# Patient Record
Sex: Female | Born: 1975
Health system: Southern US, Community
[De-identification: ages and names within clinical notes are randomized; demographics above are authoritative.]

## PROBLEM LIST (undated history)

## (undated) ENCOUNTER — Emergency Department (HOSPITAL_BASED_OUTPATIENT_CLINIC_OR_DEPARTMENT_OTHER): Admission: EM | Payer: Medicaid Other | Source: Home / Self Care

## (undated) DIAGNOSIS — B1920 Unspecified viral hepatitis C without hepatic coma: Secondary | ICD-10-CM

## (undated) DIAGNOSIS — A6000 Herpesviral infection of urogenital system, unspecified: Secondary | ICD-10-CM

## (undated) DIAGNOSIS — K219 Gastro-esophageal reflux disease without esophagitis: Secondary | ICD-10-CM

## (undated) DIAGNOSIS — I10 Essential (primary) hypertension: Secondary | ICD-10-CM

## (undated) DIAGNOSIS — E119 Type 2 diabetes mellitus without complications: Secondary | ICD-10-CM

## (undated) DIAGNOSIS — A549 Gonococcal infection, unspecified: Secondary | ICD-10-CM

## (undated) DIAGNOSIS — N83209 Unspecified ovarian cyst, unspecified side: Secondary | ICD-10-CM

## (undated) HISTORY — PX: CHOLECYSTECTOMY: SHX55

## (undated) HISTORY — DX: Gastro-esophageal reflux disease without esophagitis: K21.9

## (undated) HISTORY — PX: ABDOMINAL HYSTERECTOMY: SHX81

---

## 2010-05-29 ENCOUNTER — Ambulatory Visit: Payer: Self-pay | Admitting: Diagnostic Radiology

## 2010-05-29 ENCOUNTER — Emergency Department (HOSPITAL_BASED_OUTPATIENT_CLINIC_OR_DEPARTMENT_OTHER): Admission: EM | Admit: 2010-05-29 | Discharge: 2010-05-30 | Payer: Self-pay | Admitting: Emergency Medicine

## 2010-05-30 ENCOUNTER — Ambulatory Visit: Payer: Self-pay | Admitting: Diagnostic Radiology

## 2010-10-10 ENCOUNTER — Encounter (HOSPITAL_COMMUNITY)
Admission: RE | Admit: 2010-10-10 | Discharge: 2010-10-10 | Disposition: A | Discharge status: Home / Self Care | Payer: Medicaid Other | Source: Ambulatory Visit | Attending: Obstetrics and Gynecology | Admitting: Obstetrics and Gynecology

## 2010-10-10 LAB — CBC
HCT: 43.5 % (ref 36.0–46.0)
Hemoglobin: 14.2 g/dL (ref 12.0–15.0)
MCH: 29.1 pg (ref 26.0–34.0)
MCHC: 32.6 g/dL (ref 30.0–36.0)
MCV: 89.1 fL (ref 78.0–100.0)
RDW: 13.6 % (ref 11.5–15.5)

## 2010-10-10 LAB — COMPREHENSIVE METABOLIC PANEL
ALT: 29 U/L (ref 0–35)
BUN: 6 mg/dL (ref 6–23)
CO2: 26 mEq/L (ref 19–32)
Calcium: 9.2 mg/dL (ref 8.4–10.5)
Creatinine, Ser: 0.72 mg/dL (ref 0.4–1.2)
GFR calc non Af Amer: >60 mL/min (ref >60)
Glucose, Bld: 111 mg/dL — ABNORMAL HIGH (ref 70–99)
Sodium: 136 mEq/L (ref 135–145)
Total Protein: 7.9 g/dL (ref 6.0–8.3)

## 2010-10-10 LAB — SURGICAL PCR SCREEN
MRSA, PCR: NEGATIVE
Staphylococcus aureus: NEGATIVE

## 2010-10-17 ENCOUNTER — Other Ambulatory Visit: Payer: Self-pay | Admitting: Obstetrics and Gynecology

## 2010-10-17 ENCOUNTER — Inpatient Hospital Stay (HOSPITAL_COMMUNITY)
Admission: RE | Admit: 2010-10-17 | Discharge: 2010-10-20 | DRG: 743 | Disposition: A | Discharge status: Home / Self Care | Payer: Medicaid Other | Source: Ambulatory Visit | Attending: Obstetrics and Gynecology | Admitting: Obstetrics and Gynecology

## 2010-10-17 DIAGNOSIS — E785 Hyperlipidemia, unspecified: Secondary | ICD-10-CM | POA: Diagnosis present

## 2010-10-17 DIAGNOSIS — IMO0002 Reserved for concepts with insufficient information to code with codable children: Secondary | ICD-10-CM | POA: Diagnosis not present

## 2010-10-17 DIAGNOSIS — N920 Excessive and frequent menstruation with regular cycle: Principal | ICD-10-CM | POA: Diagnosis present

## 2010-10-17 DIAGNOSIS — N8000 Endometriosis of the uterus, unspecified: Secondary | ICD-10-CM | POA: Diagnosis present

## 2010-10-17 DIAGNOSIS — I10 Essential (primary) hypertension: Secondary | ICD-10-CM | POA: Diagnosis present

## 2010-10-17 LAB — HCG, SERUM, QUALITATIVE: Preg, Serum: NEGATIVE

## 2010-10-17 LAB — TYPE AND SCREEN
ABO/RH(D): B POS
Antibody Screen: NEGATIVE

## 2010-10-18 LAB — BASIC METABOLIC PANEL
BUN: 8 mg/dL (ref 6–23)
Chloride: 100 mEq/L (ref 96–112)
Creatinine, Ser: 0.86 mg/dL (ref 0.4–1.2)
GFR calc non Af Amer: >60 mL/min (ref >60)
Glucose, Bld: 129 mg/dL — ABNORMAL HIGH (ref 70–99)
Potassium: 4.3 mEq/L (ref 3.5–5.1)

## 2010-10-18 LAB — CBC
HCT: 42 % (ref 36.0–46.0)
MCH: 28.5 pg (ref 26.0–34.0)
MCV: 89.9 fL (ref 78.0–100.0)
Platelets: 166 10*3/uL (ref 150–400)
RDW: 13.8 % (ref 11.5–15.5)
WBC: 11.8 10*3/uL — ABNORMAL HIGH (ref 4.0–10.5)

## 2010-10-19 ENCOUNTER — Inpatient Hospital Stay (HOSPITAL_COMMUNITY): Payer: Medicaid Other

## 2010-10-27 NOTE — Op Note (Signed)
  Christine Callahan, Christine Callahan               ACCOUNT NO.:  [id number]  MEDICAL RECORD NO.:  [id number]           PATIENT TYPE:  I  LOCATION:  9318                          FACILITY:  [facility]  PHYSICIAN:  Excell Seltzer. Annabell Howells, M.D.    DATE OF BIRTH:  11/22/1975  DATE OF PROCEDURE:  10/17/2010 DATE OF DISCHARGE:                              OPERATIVE REPORT   The patient of Dr. Geryl Rankins  PROCEDURE:  Cystoscopy.  PREOPERATIVE DIAGNOSIS:  Rule out ureteral injury.  POSTOPERATIVE DIAGNOSIS:  No evidence of ureteral injury.  SURGEON:  Excell Seltzer. Annabell Howells, M.D.  ANESTHESIA:  General.  SPECIMEN:  None.  DRAIN:  None.  COMPLICATIONS:  None.  INDICATIONS:  Ms. [name] is a 35 year old African American female who underwent a difficult hysterectomy.  Postoperatively, she was noted to have no urine output.  She was given indigo carmine and then after 40 minutes, she still had no urine output.  There was concern for ureteral injury, so I was asked to see her.  In the interim, she had received additional hydration.  FINDINGS AND PROCEDURE:  She was in the lithotomy position.  Her perineum was reprepped and the drapes were adjusted.  A 21-French scope with a 30 degrees lens was inserted.  Inspection revealed a normal urethra.  The bladder wall was intact without injury.  The ureteral orifices were normal in the anatomic position and after a period of time, blue urine was noted to efflux from both ureteral orifices alleviating the need for retrograde pyelography. At this point, the cystoscope was removed.  A Foley catheter was reinserted.  She was taken down from lithotomy position.  Her anesthetic was reversed.  She was admitted to the recovery room in stable condition.  There were no complications.     Excell Seltzer. Annabell Howells, M.D.     JJW/MEDQ  D:  10/17/2010  T:  10/18/2010  Job:  [id number]  cc:   Pieter Partridge, MD Fax: [phone]  Electronically Signed by Bjorn Pippin M.D. on 10/27/2010 02:15:50  PM

## 2010-11-09 NOTE — Op Note (Signed)
Christine Callahan, Christine Callahan               ACCOUNT NO.:  [id number]  MEDICAL RECORD NO.:  [id number]           PATIENT TYPE:  I  LOCATION:  9318                          [facility]ACILITY:  [facility]  PHYSICIAN:  Pieter Partridge, MD   DATE OF BIRTH:  1976-03-21  DATE OF PROCEDURE:  10/17/2010 DATE OF DISCHARGE:                              OPERATIVE REPORT   PREOPERATIVE DIAGNOSIS:  Menorrhagia.  POSTOPERATIVE DIAGNOSIS:  Menorrhagia, omental adhesions.  PROCEDURE:  Laparoscopic-assisted vaginal hysterectomy, cystoscopy by urologist, Dr. Annabell Howells.  SURGEON:  Shela Nevin. Dion Body, MD  ASSISTANT:  Arlyce Harman, MD  INTRAOPERATIVE CONSULTATION:  Urologist, Dr. Annabell Howells.  ANESTHESIA:  General.  FINDINGS:  Uterus adhesed to anterior cul-de-sac, omental adhesions, and normal ovaries bilaterally.  SPECIMEN:  Uterus and cervix disposition to path.  ESTIMATED BLOOD LOSS:  150 mL.  URINE OUTPUT:  250 mL, blood tinged or bloody.  COMPLICATIONS:  None.  DISPOSITION:  To PACU in stable condition.  INDICATION:  Ms. Christine Callahan is a 35 year old gravida 3, para 2-0-01 with a history of menometrorrhagia for 2 years.  She has passed clots and flooding changing pad and tampons every hour.  She has even been to the emergency room at one point for excessive bleeding.  The patient desires definitive management.  She declined a endometrial ablation or medical therapy.  She does have chronic hypertension.  The patient was counseled on the risks, benefits, and alternatives.  Risks being injury to the bladder and ureters due to history of previous C-section and tubal.  The patient understood risk and accepted and desired to proceed with surgery.  PROCEDURE IN DETAIL:  Ms. Christine Callahan was identified in the holding area.  She was then taken to the operating room with IV fluids running.  She underwent general endotracheal anesthesia without complication.  She was placed in the dorsal lithotomy position with her left arm tucked at  her side and prepped and draped in a normal sterile fashion.  A Foley catheter was placed.  A Graves speculum was then placed in the vagina to identify the cervix.  The anterior lip of the cervix was grasped with the single-tooth tenaculum and the Hulka manipulator was advanced.  Single-tooth tenaculum was then removed.  Attention was then turned to the abdomen.  She had a previous laparoscopic incision below the umbilicus which had a keloid that was excised.  Then a 5-mm trocar was advanced through the abdomen while the patient was in Trendelenburg.  Intra-abdominal access was confirmed with the laparoscope.  CO2 gas was then used to insufflate the abdomen and the uterus was identified.  Another port on the left-hand side was placed, 5-mm trocar under direct visualization.  Of note, prior to inserting both of the ports 0.25% Marcaine was injected at the site.  There was an omental adhesion that was to the anterior abdominal wall which was blocking the view of the right adnexa.  I attempted to take that down with the gyrus and that was with some difficulty.  I then used the Endoshears to take down some of the omental adhesions, so that I could see the right-hand side.  I did not feel comfortable taking down all of the adhesions because I could not see around it.  We attempted to put another port which was in the mesh of the adnexa.  We were unable to put it in the right lower quadrant so it could be visualized.  It was able to displace some of the omentum out of the view of the field.  The round ligaments were grasped with the gyrus and cauterized and transected.  The right adnexa was then attempted to be transected. However, after the previous tubal, it was basically very free.  On the left-hand side, the round ligament was not very obvious and obliterated. However, the area was transected with the gyrus and then the adnexa was freed up as well.  I was unable to take it down because  of the adhesions of the uterus to the anterior wall, so then we went below.  A weighted speculum was then placed in the vagina which due to some redundant tissue and the length of the vagina that continued to bubble up in the away.  So, we used a long duckbill speculum prior to getting into the cul-de-sac.  The vasopressin was injected paracervically.  The cervix was then incised with the Bovie circumferentially.  The posterior cul-de-sac was then entered sharply with yellow fluid noted that was extended and the duckbill speculum was then placed.  I was unable to enter the anterior cul-de-sac, so with the curved Heaney clamps worked posteriorly transecting the uterosacrals and the uterine arteries and suture ligating those with 0-Vicryl.  I again attempted to get in anteriorly and I did never was able to get a plane on the anterior cul- de-sac.  I continued to work laterally with lateral wall retractors and at one point had the Breisky retractors but did grasping or skeletonizing the uterus with the curved Heaney clamps and cutting with the curved Mayo scissors and tying it down with 0-Vicryl.  At one point, I got to the cornu on each side and grasped the fundus of the uterus posteriorly, but it would not flip and it just would not give because of that dense adhesion on the front.  I was able to grasp the fundus and I had to reach from behind the fundus and placed my index finger and thumb on the adhesions.  I then placed a Heaney clamp blindly because I was unable to see it because it was so high.  I clamped very close to the uterus and then cut a very thick adhesion.  I did that several times and eventually the uterus came out.  Unfortunately because I was unable to see, I did not use a needle, I just used freehand ties twice to tie down the pedicle and flushing the first one but no bleeding was noted.  During the procedure, the ovary I believe what was the right ovary within the  field and that was displaced.  The bowel was also in the field and that was displaced with initially a laparotomy sponge and after that reamed with sponge on a stick at the uterus.  At one point, we actually transected the cervix to visualize to get up higher on the uterus.  The cuff was then closed with 0-Vicryl in a continuous running fashion. The angles were anchored with a 0-Vicryl.  Second layer of imbrication was used with 2-0 Vicryl.  There was no bleeding noted.  I called for indigo carmine to be injected while we were closing the  abdominal incisions and I closed the abdominal incisions with 4-0 Monocryl.  It was initially bleeding pretty briskly on the right lower trocar site but that stopped after it was reapproximated.  All incisions were closed with 4-0 Monocryl and Dermabond was applied.  At the completion of closing the incisions, there was no blue dye noted.  There was not even any urine in the Foley catheter.  We waited a little bit and the bolus was given and she did have some blood-tinged urine, but no blue dye was noted.  At that time, the urologist was called and consult was obtained.  The dye was given at 11:45 a.m. and he used was probably in the OR at about 12:45 and still no blue dye had appeared and there was minimal output.  The Foley catheter was checked to make sure it was not kinked which it was not.  He did the cystoscopy.  There were no bladder injuries noted on the left side.  There was blue dye jetting from the left ureter.  We waited on the right-hand side and there was some blue dye indigo from the right ureter.  The Foley catheter was then replaced.  The patient tolerated the procedure well.  No known complications from anesthesia.  When I looked back again intra-abdominally with the laparoscope after the procedure, definitely the peritoneum was adhesed and appeared normal postsurgical.  Bladder did not appear to be injured and there was  no active bleeding.  The patient tolerated the procedure well.  All instrument, sponge, and needle counts were correct x3.  She was then taken to the operating room in stable condition.     Pieter Partridge, MD     EBV/MEDQ  D:  10/17/2010  T:  10/18/2010  Job:  [id number]  Electronically Signed by Geryl Rankins MD on 11/09/2010 10:21:35 PM

## 2010-11-21 NOTE — Discharge Summary (Signed)
NAMEJOYANN, Christine Callahan               ACCOUNT NO.:  [id number]  MEDICAL RECORD NO.:  [id number]           PATIENT TYPE:  I  LOCATION:  9318                          FACILITY:  [facility]  PHYSICIAN:  Pieter Partridge, MD   DATE OF BIRTH:  [birth year]  DATE OF ADMISSION:  10/17/2010 DATE OF DISCHARGE:  10/20/2010                              DISCHARGE SUMMARY   ADMITTING DIAGNOSIS:  Menorrhagia.  DISCHARGE DIAGNOSES: 1. Menorrhagia. 2. Left acromioclavicular joint strain. 3. Omental adhesions.  PROCEDURES: 1. Laparoscopic-assisted vaginal hysterectomy. 2. Cystoscopy by urologist, Dr. Annabell Howells.  HISTORY OF PRESENT ILLNESS:  Please see dictated history and physical, but this is a 34-year gravida 3, para 2-8-1 with menometrorrhagia x2 years, positive clots and flooding, severe, cycles every month, desired definitive management, history of chronic hypertension which is managed by primary care doctor, and migraines.  The patient declined endometrial ablation and medications, desired definitive management.  On October 17, 2010, Christine Callahan underwent a laparoscopic-assisted vaginal hysterectomy which was essentially uncomplicated but the procedure was challenging due to adhesions of the uterus to the anterior cul-de-sac. The procedure was approximately 3-4 hours long.  EBL was 150.  Urine out was blood tinged to bloody of 250.  A cystoscopy was done at the end because indigo carmine was given and did not spill after 45 minutes. The urologist, upon arrival, did a cystoscopy and saw bilateral spillage of indigo carmine.  There are also no bladder injuries noted.  In the recovery room, the patient still, while partially sedated, was complaining of left shoulder pain on postoperative day #1, that was her major complaint that she was having significant discomfort in her left shoulder.  Her left arm was tucked for a laparoscopy during the surgery and no plates were put up against that arm.  Her movement  was limited to pain.  She was tolerating a regular diet and walking and no flatus yet as of postop day #1.  Her blood pressure was elevated overnight, but trended down to 145/72.  Her postop hemoglobin went from 14.2 to 13.3, white count was normal.  Blood pressure meds were continued.  Urine output was continued to be observed, it slowly cleared throughout her postoperative stay.  On postoperative day #2, the patient still was complaining of pain, unable to sleep.  An Orthopedic consult was then obtained and the orthopedists asked for some x-rays of the left shoulder to be taken.  He did see her late in the evening of postoperative day #2 and diagnosed her with an AC joint strain and prescribed Naprosyn 500 mg twice a day for 2 weeks.  The patient stayed an extra day just because it was so late to be discharged.  On the postoperative day #3, the patient said her shoulder was feeling a little bit better.  She did not have any bleeding or pain.  She had no abdominal pain, minimal spotting from the vagina, and was urinating without difficulty.  She was discharged home.  DISCHARGE INFORMATION:  Follow up in 2 weeks.  DISCHARGE MEDICATIONS:  Naprosyn 500 mg 1 tablet p.o. b.i.d. x4 weeks. She was to  continue her Prilosec, Wellbutrin, amlodipine.  She was also on losartan/HCTZ.  Percocet 5/325, #15 were dispensed.  ACTIVITY:  Pelvic rest, no heavy lifting.  DIET:  Low sodium.  Wound care was reviewed.     Pieter Partridge, MD     EBV/MEDQ  D:  11/15/2010  T:  11/16/2010  Job:  [id number]  Electronically Signed by Geryl Rankins MD on 11/21/2010 09:58:00 AM

## 2011-07-11 DIAGNOSIS — A6000 Herpesviral infection of urogenital system, unspecified: Secondary | ICD-10-CM

## 2011-07-11 HISTORY — DX: Herpesviral infection of urogenital system, unspecified: A60.00

## 2011-07-23 ENCOUNTER — Emergency Department (INDEPENDENT_AMBULATORY_CARE_PROVIDER_SITE_OTHER): Payer: Medicaid Other

## 2011-07-23 ENCOUNTER — Encounter (HOSPITAL_BASED_OUTPATIENT_CLINIC_OR_DEPARTMENT_OTHER): Payer: Self-pay | Admitting: *Deleted

## 2011-07-23 ENCOUNTER — Emergency Department (HOSPITAL_BASED_OUTPATIENT_CLINIC_OR_DEPARTMENT_OTHER)
Admission: EM | Admit: 2011-07-23 | Discharge: 2011-07-23 | Disposition: A | Discharge status: Home / Self Care | Payer: Medicaid Other | Attending: Emergency Medicine | Admitting: Emergency Medicine

## 2011-07-23 DIAGNOSIS — S6000XA Contusion of unspecified finger without damage to nail, initial encounter: Secondary | ICD-10-CM | POA: Insufficient documentation

## 2011-07-23 DIAGNOSIS — I880 Nonspecific mesenteric lymphadenitis: Secondary | ICD-10-CM | POA: Insufficient documentation

## 2011-07-23 DIAGNOSIS — I10 Essential (primary) hypertension: Secondary | ICD-10-CM | POA: Insufficient documentation

## 2011-07-23 DIAGNOSIS — IMO0002 Reserved for concepts with insufficient information to code with codable children: Secondary | ICD-10-CM | POA: Insufficient documentation

## 2011-07-23 DIAGNOSIS — S6990XA Unspecified injury of unspecified wrist, hand and finger(s), initial encounter: Secondary | ICD-10-CM

## 2011-07-23 DIAGNOSIS — N9489 Other specified conditions associated with female genital organs and menstrual cycle: Secondary | ICD-10-CM

## 2011-07-23 DIAGNOSIS — R109 Unspecified abdominal pain: Secondary | ICD-10-CM

## 2011-07-23 DIAGNOSIS — F17200 Nicotine dependence, unspecified, uncomplicated: Secondary | ICD-10-CM | POA: Insufficient documentation

## 2011-07-23 DIAGNOSIS — M79609 Pain in unspecified limb: Secondary | ICD-10-CM

## 2011-07-23 DIAGNOSIS — Z90710 Acquired absence of both cervix and uterus: Secondary | ICD-10-CM

## 2011-07-23 DIAGNOSIS — S6980XA Other specified injuries of unspecified wrist, hand and finger(s), initial encounter: Secondary | ICD-10-CM

## 2011-07-23 DIAGNOSIS — W230XXA Caught, crushed, jammed, or pinched between moving objects, initial encounter: Secondary | ICD-10-CM

## 2011-07-23 HISTORY — DX: Essential (primary) hypertension: I10

## 2011-07-23 LAB — URINALYSIS, ROUTINE W REFLEX MICROSCOPIC
Bilirubin Urine: NEGATIVE
Glucose, UA: NEGATIVE mg/dL
Ketones, ur: NEGATIVE mg/dL
Protein, ur: NEGATIVE mg/dL
pH: 6 (ref 5.0–8.0)

## 2011-07-23 MED ORDER — CIPROFLOXACIN HCL 500 MG PO TABS: 500.0000 mg | ORAL_TABLET | Freq: Two times a day (BID) | ORAL | Status: AC

## 2011-07-23 MED ORDER — HYDROCODONE-ACETAMINOPHEN 5-500 MG PO TABS: 1.0000 | ORAL_TABLET | Freq: Four times a day (QID) | ORAL | Status: AC | PRN

## 2011-07-23 MED ORDER — METRONIDAZOLE 500 MG PO TABS: 500.0000 mg | ORAL_TABLET | Freq: Two times a day (BID) | ORAL | Status: AC

## 2011-07-23 MED ORDER — KETOROLAC TROMETHAMINE 60 MG/2ML IM SOLN
60.0000 mg | Freq: Once | INTRAMUSCULAR | Status: AC
  Administered 2011-07-23: 60 mg via INTRAMUSCULAR
  Filled 2011-07-23: qty 2

## 2011-07-23 NOTE — ED Notes (Signed)
rx x 3 for cipro flagyl and hydrocodone given. Pt verbalizes understanding to f/u with PCP regarding CT results

## 2011-07-23 NOTE — ED Notes (Signed)
Patient transported to X-ray and returned 

## 2011-07-23 NOTE — ED Notes (Signed)
Pt is here for multiple complaints. She slammed her left ring finger in the door, left foot has been hurting for 2 weeks and she has right flank pain x 2 days. Dr. Is treating her for recurrent UTI's. "want me to see a specialist for possible stone"

## 2011-07-23 NOTE — ED Provider Notes (Signed)
History     CSN: [id number]  Arrival date & time 07/23/11  1724   First MD Initiated Contact with Patient 07/23/11 1751      Chief Complaint  Patient presents with  . Flank Pain    (Consider location/radiation/quality/duration/timing/severity/associated sxs/prior treatment) HPI Comments: Pt states that she has been having multiple uti and her doctor is concerned about a stone and when the pain worsened yesterday she decided that she needed to come in today:pt states that she slammed her left ring finger in the door and is now having pain:pt states that she is also have left foot pain which has been going on for 2 weeks:pt denies any injury  Patient is a 36 y.o. female presenting with flank pain. The history is provided by the patient. No language interpreter was used.  Flank Pain This is a new problem. The current episode started 1 to 4 weeks ago. The problem occurs constantly. The problem has been unchanged. Associated symptoms include vomiting. Pertinent negatives include no fever. The symptoms are aggravated by nothing. She has tried nothing for the symptoms.    Past Medical History  Diagnosis Date  . Hypertension     Past Surgical History  Procedure Date  . Abdominal hysterectomy   . Cholecystectomy     History reviewed. No pertinent family history.  History  Substance Use Topics  . Smoking status: Current Everyday Smoker  . Smokeless tobacco: Not on file  . Alcohol Use: Yes    OB History    Grav Para Term Preterm Abortions TAB SAB Ect Mult Living                  Review of Systems  Constitutional: Negative for fever.  Gastrointestinal: Positive for vomiting.  Genitourinary: Positive for flank pain.  All other systems reviewed and are negative.    Allergies  Review of patient's allergies indicates no known allergies.  Home Medications   Current Outpatient Rx  Name Route Sig Dispense Refill  . ADULT MULTIVITAMIN W/MINERALS CH Oral Take 1 tablet by  mouth daily.    Marland Kitchen PRILOSEC PO Oral Take 1 capsule by mouth daily.    Marland Kitchen PRESCRIPTION MEDICATION Oral Take 1 tablet by mouth daily. Blood pressure medication      BP 156/92  Pulse 96  Temp(Src) 98.8 F (37.1 C) (Oral)  Resp 18  Ht 5\' 9"  (1.753 m)  Wt 260 lb (117.935 kg)  BMI 38.40 kg/m2  SpO2 100%  Physical Exam  Nursing note and vitals reviewed. Constitutional: She is oriented to person, place, and time. She appears well-developed and well-nourished.  Eyes: Conjunctivae and EOM are normal.  Neck: Neck supple.  Cardiovascular: Normal rate and regular rhythm.   Pulmonary/Chest: Effort normal and breath sounds normal.  Abdominal: Soft. Bowel sounds are normal. There is no tenderness. There is CVA tenderness.  Musculoskeletal:       No swelling redness or deformity noted to the left foot:pt has mild swelling noted to the middle phalanx of the left ring finger  Neurological: She is oriented to person, place, and time.  Skin: Skin is warm and dry.  Psychiatric: She has a normal mood and affect.    ED Course  Procedures (including critical care time)   Labs Reviewed  URINALYSIS, ROUTINE W REFLEX MICROSCOPIC   Ct Abdomen Pelvis Wo Contrast  07/23/2011  *RADIOLOGY REPORT*  Clinical Data: Right flank pain for 2 months.  White count 11.8. History of hypertension, cholecystectomy, hysterectomy.  CT ABDOMEN  AND PELVIS WITHOUT CONTRAST  Technique:  Multidetector CT imaging of the abdomen and pelvis was performed following the standard protocol without intravenous contrast.  Comparison: None.  Findings: Lung bases are unremarkable.  No focal abnormality identified within the liver, spleen, pancreas, adrenal glands, or kidneys.  No intrarenal or ureteral calculi are identified.  The stomach, small bowel loops have a normal appearance. The appendix is well seen and has a normal appearance.  The colonic loops have a normal appearance.  The uterus is surgically absent.  Within the left adnexal  region, there is a rounded mass measuring 5.4 x 4.3 cm.  Although this is continuous with the sigmoid colon, adnexal process such as ovarian or fallopian tube processes favored.  The right adnexal region is unremarkable by CT.  There is no pelvic fluid or pelvic adenopathy.  Within the central mesentery, there is minimal strandy appearance. Small mesenteric lymph nodes are identified, measuring up to 1 cm in diameter.  IMPRESSION:  1.  No evidence for acute or renal stones. 2.  Mild stranding in the central mesentery, nonspecific in appearance.  Differential diagnosis includes mesenteric adenitis, gastroenteritis, or less likely, lymphoma. 3.  Left adnexal mass warrants further evaluation with non emergent follow-up pelvic ultrasound. 4.  Status post hysterectomy.  Original Report Authenticated By: Patterson Hammersmith, M.D.   Dg Finger Ring Left  07/23/2011  *RADIOLOGY REPORT*  Clinical Data: Injury, pain.  LEFT RING FINGER 2+V  Comparison: Plain films left hand 05/29/2010.  Findings: There is no acute bony or joint abnormality. Osteoarthritis of the DIP joint is noted.  IMPRESSION: No acute finding.  Original Report Authenticated By: Bernadene Bell. D'ALESSIO, M.D.     1. Mesenteric adenitis   2. Flank pain   3. Finger contusion   4. Adnexal mass       MDM  Discussed ct finding with ZO:XWRU treat for mesenteric adenitis and pt will have follow up with her pcp:no findings associated with the finger       Teressa Lower, NP 07/23/11 1927

## 2011-07-23 NOTE — ED Provider Notes (Signed)
Medical screening examination/treatment/procedure(s) were performed by non-physician practitioner and as supervising physician I was immediately available for consultation/collaboration.   Jakiah Bienaime A Laiza Veenstra, MD 07/23/11 2139 

## 2011-08-07 ENCOUNTER — Ambulatory Visit: Payer: Medicaid Other | Admitting: Physical Medicine & Rehabilitation

## 2011-08-07 ENCOUNTER — Encounter: Payer: Medicaid Other | Attending: Physical Medicine & Rehabilitation

## 2011-08-17 ENCOUNTER — Ambulatory Visit (HOSPITAL_BASED_OUTPATIENT_CLINIC_OR_DEPARTMENT_OTHER): Payer: Medicaid Other | Admitting: Physical Medicine & Rehabilitation

## 2011-08-17 ENCOUNTER — Encounter: Payer: Medicaid Other | Attending: Physical Medicine & Rehabilitation

## 2011-08-17 DIAGNOSIS — M545 Low back pain, unspecified: Secondary | ICD-10-CM

## 2011-08-17 DIAGNOSIS — I10 Essential (primary) hypertension: Secondary | ICD-10-CM | POA: Insufficient documentation

## 2011-08-17 DIAGNOSIS — F17200 Nicotine dependence, unspecified, uncomplicated: Secondary | ICD-10-CM | POA: Insufficient documentation

## 2011-08-17 DIAGNOSIS — M79609 Pain in unspecified limb: Secondary | ICD-10-CM | POA: Insufficient documentation

## 2011-08-17 DIAGNOSIS — IMO0002 Reserved for concepts with insufficient information to code with codable children: Secondary | ICD-10-CM

## 2011-08-17 NOTE — Progress Notes (Signed)
Christine Callahan is a 36 year old female, who gives about a 2-year history of low back pain.  She has had a ED visit in November, 2011 where she was lifting a TV and strained her back as well as her finger, however, she states that she had some pain before that time as well.  Within the last 6 months, she has had some left dorsal foot pain and sometimes some numbness and tingling in that foot as well.  Her average pain is 7/10. It gets up to 9 at sometimes, interferes with activity at a moderate level.  Her pain is described as sharp, burning, dull.  Pain is worse with walking, bending, standing; improves with either sitting in a supportive position or lying down.  Walking tolerance 5 minutes.  She can climb steps.  She can drive.  REVIEW OF SYSTEMS:  Trouble walking, tingling, urinary retention, painful urination, currently on Cipro, awaiting Urology appointment, weight gain.  PRIMARY CARE PHYSICIAN:  Jackie Plum, MD  PAST MEDICAL HISTORY:  Hypertension.  SOCIAL HISTORY:  Single, smokes half-pack per day.  Nondrinker.  No illegal drugs reported.  OBJECTIVE:  VITAL SIGNS:  Blood pressure 130/77, pulse 84, respirations regular, O2 sat 93% on room air, weight 272 pounds, height 5 feet 10 inches. GENERAL:  This is an obese female, in no acute distress.  Mood and affect are appropriate. MUSCULOSKELETAL:  Her neck has full range of motion.  Upper extremity strength is normal.  Upper extremity range of motion normal.  Lower extremity strength and range of motion are normal.  Deep tendon reflexes reduced in the right knee compared to left knee, but 1+ at bilateral ankles.  She has decreased sensation in the left L4 dermatomal distribution.  There is no evidence of atrophy, no skin discoloration. No lower extremity edema.  She has good pulses in the lower extremities. Her lumbar spine range of motion is 50% forward flexion and extension.  IMPRESSION: 1. Lumbar pain.  I reviewed x-rays from  May 30, 2011, really no     disk space narrowing.  She may have a radicular component, although     this is not clear-cut, other than L4 sensory loss.  We will trial     her on some gabapentin 100 mg t.i.d., as well as some Ultracet 1     p.o. t.i.d. 2. She has been through physical therapy once, but really did not     sound like they did any lumbar stabilization program.  I will write     specific instructions, send her back for 1 visit a week x4 weeks     and then reassess.  If she is not any better by that time, I will     see her again, I will order MRI.  We will consider lumbar     injections or other intervention depending on result. 3. Overall, I do not see a big role for narcotic analgesics in this     situation.  MEDICATIONS:  Losartan and hydrochlorothiazide 100/12.5 daily, nitrofurantoin 100 mg at bedtime, ciprofloxacin 500 mg b.i.d., omeprazole 20 mg a day, butalbital 1-2 q.4 hours p.r.n., amlodipine 10 mg daily, Pyridium 200 mg t.i.d., metronidazole 500 mg b.i.d., and phentermine 37.5 daily.  ALLERGIES:  None known.  PHARMACY:  [facility] in [facility].  Discussed with patient, agrees with plan.  Gave her some educational material on back basics.     Erick Colace, M.D. Electronically Signed    AEK/MedQ D:  08/17/2011 14:32:12  T:  08/17/2011 21:13:23  Job #:  [id number]  cc:   Jackie Plum, M.D. Fax: [phone]

## 2011-09-08 ENCOUNTER — Encounter: Payer: Medicaid Other | Attending: Physical Medicine & Rehabilitation

## 2011-09-08 ENCOUNTER — Encounter: Payer: Self-pay | Admitting: Physical Medicine & Rehabilitation

## 2011-09-08 ENCOUNTER — Ambulatory Visit (HOSPITAL_BASED_OUTPATIENT_CLINIC_OR_DEPARTMENT_OTHER): Payer: Medicaid Other | Admitting: Physical Medicine & Rehabilitation

## 2011-09-08 DIAGNOSIS — F17200 Nicotine dependence, unspecified, uncomplicated: Secondary | ICD-10-CM | POA: Insufficient documentation

## 2011-09-08 DIAGNOSIS — M25579 Pain in unspecified ankle and joints of unspecified foot: Secondary | ICD-10-CM

## 2011-09-08 DIAGNOSIS — M545 Low back pain, unspecified: Secondary | ICD-10-CM | POA: Insufficient documentation

## 2011-09-08 DIAGNOSIS — I10 Essential (primary) hypertension: Secondary | ICD-10-CM | POA: Insufficient documentation

## 2011-09-08 DIAGNOSIS — M5432 Sciatica, left side: Secondary | ICD-10-CM

## 2011-09-08 DIAGNOSIS — M5430 Sciatica, unspecified side: Secondary | ICD-10-CM

## 2011-09-08 DIAGNOSIS — M79609 Pain in unspecified limb: Secondary | ICD-10-CM | POA: Insufficient documentation

## 2011-09-08 MED ORDER — GABAPENTIN 300 MG PO CAPS: 300.0000 mg | ORAL_CAPSULE | Freq: Three times a day (TID) | ORAL | Status: DC

## 2011-09-08 MED ORDER — TRAMADOL-ACETAMINOPHEN 37.5-325 MG PO TABS: 1.0000 | ORAL_TABLET | ORAL | Status: DC | PRN

## 2011-09-08 NOTE — Progress Notes (Signed)
Subjective:    Patient ID: Christine Callahan, female    DOB: 1975-10-04, 36 y.o.   MRN: [id number]  HPI Christine Callahan is a 36 year old female, who gives about a 2-year history of low  back pain. She has had a ED visit in November, 2011 where she was  lifting a TV and strained her back as well as her finger, however, she  states that she had some pain before that time as well. Within the last  6 months, she has had some left dorsal foot pain and sometimes some  numbness and tingling in that foot as well. I ordered physical therapy last visit, we trialed her on some gabapentin and Ultracet. Reviewed x-ray. No significant disc space narrowing in 2011. She has not had any MRI of the lumbar area. She has seen a podiatrist who is planning to do some type of injections however she cannot tell me exactly what part will be injected. She plans to start her physical therapy next week Pain Inventory Average Pain 7 Pain Right Now 7 My pain is intermittent, sharp, dull and stabbing  In the last 24 hours, has pain interfered with the following? General activity 5 Relation with others 5 Enjoyment of life 4 What TIME of day is your pain at its worst? daytime and evening Sleep (in general) Poor  Pain is worse with: walking and bending Pain improves with: rest, heat/ice and lumbar support binder Relief from Meds: 6  Mobility walk without assistance how many minutes can you walk? 5 ability to climb steps?  yes do you drive?  yes Do you have any goals in this area?  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  no  Physicians involved in your care Any changes since last visit?  no Primary care Dr Cloyd Stagers Bonsu      Review of Systems  Constitutional: Positive for unexpected weight change.       Weight gain and high blood sugars  HENT: Negative.    Eyes: Negative.   Respiratory: Negative.   Cardiovascular: Negative.   Gastrointestinal: Negative.   Genitourinary: Negative.   Musculoskeletal: Positive for back pain.  Skin: Negative.   Neurological: Negative.   Hematological: Negative.   Psychiatric/Behavioral: Negative.        Objective:   Physical Exam  Constitutional: She is oriented to person, place, and time. She appears well-developed. No distress.       Obese  HENT:  Head: Normocephalic and atraumatic.  Eyes: Pupils are equal, round, and reactive to light.  Neck: Normal range of motion.  Musculoskeletal:       Right hip: Normal.       Left hip: She exhibits decreased range of motion.       Right knee: Normal.       Left knee: Normal.       Right ankle: Normal.       Left ankle: She exhibits decreased range of motion. tenderness. Lateral malleolus tenderness found.       Lumbar back: She exhibits decreased range of motion and pain. She exhibits no tenderness, no swelling and no spasm.  Neurological: She is alert and oriented to person, place, and time. She has normal reflexes. A sensory deficit is present.       Reduced sensation in the left L4, L5, and S1 dermatomal distribution  Skin: Skin is warm and dry.  Psychiatric: She has a normal mood and affect. Her behavior is normal. Judgment and thought content normal.          Assessment & Plan:  1. Lumbar pain and left  foot and ankle numbness and pain. It is difficult to say whether or not her left foot and ankle problems stem from her back or not. At this point she will followup with the podiatrist to see whether the injections around her foot are helpful. Will also initiate some physical therapy for her lumbar spine pain. We will trial her on increased dose of gabapentin as well as   Ultracet. I'll see her back in one month and if no better order MRI lumbar spine

## 2011-09-08 NOTE — Patient Instructions (Signed)
Sciatica with Rehab The sciatic nerve runs from the back down the leg and is responsible for sensation and control of the muscles in the back (posterior) side of the thigh, lower leg, and foot. Sciatica is a condition that is characterized by inflammation of this nerve.  SYMPTOMS   Signs of nerve damage, including numbness and/or weakness along the posterior side of the lower extremity.   Pain in the back of the thigh that may also travel down the leg.   Pain that worsens when sitting for long periods of time.   Occasionally, pain in the back or buttock.  CAUSES  Inflammation of the sciatic nerve is the cause of sciatica. The inflammation is due to something irritating the nerve. Common sources of irritation include:  Sitting for long periods of time.   Direct trauma to the nerve.   Arthritis of the spine.   Herniated or ruptured disk.   Slipping of the vertebrae (spondylolithesis)   Pressure from soft tissues, such as muscles or ligament-like tissue (fascia).  RISK INCREASES WITH:  Sports that place pressure or stress on the spine (football or weightlifting).   Poor strength and flexibility.   Failure to warm-up properly before activity.   Family history of low back pain or disk disorders.   Previous back injury or surgery.   Poor body mechanics, especially when lifting, or poor posture.  PREVENTION   Warm up and stretch properly before activity.   Maintain physical fitness:   Strength, flexibility, and endurance.   Cardiovascular fitness.   Learn and use proper technique, especially with posture and lifting. When possible, have coach correct improper technique.   Avoid activities that place stress on the spine.  PROGNOSIS If treated properly, then sciatica usually resolves within 6 weeks. However, occasionally surgery is necessary.  RELATED COMPLICATIONS   Permanent nerve damage, including pain, numbness, tingle, or weakness.   Chronic back pain.   Risks  of surgery: infection, bleeding, nerve damage, or damage to surrounding tissues.  TREATMENT Treatment initially involves resting from any activities that aggravate your symptoms. The use of ice and medication may help reduce pain and inflammation. The use of strengthening and stretching exercises may help reduce pain with activity. These exercises may be performed at home or with referral to a therapist. A therapist may recommend further treatments, such as transcutaneous electronic nerve stimulation (TENS) or ultrasound. Your caregiver may recommend corticosteroid injections to help reduce inflammation of the sciatic nerve. If symptoms persist despite non-surgical (conservative) treatment, then surgery may be recommended. MEDICATION  If pain medication is necessary, then nonsteroidal anti-inflammatory medications, such as aspirin and ibuprofen, or other minor pain relievers, such as acetaminophen, are often recommended.   Do not take pain medication for 7 days before surgery.   Prescription pain relievers may be given if deemed necessary by your caregiver. Use only as directed and only as much as you need.   Ointments applied to the skin may be helpful.   Corticosteroid injections may be given by your caregiver. These injections should be reserved for the most serious cases, because they may only be given a certain number of times.  HEAT AND COLD  Cold treatment (icing) relieves pain and reduces inflammation. Cold treatment should be applied for 10 to 15 minutes every 2 to 3 hours for inflammation and pain and immediately after any activity that aggravates your symptoms. Use ice packs or massage the area with a piece of ice (ice massage).   Heat treatment may   be used prior to performing the stretching and strengthening activities prescribed by your caregiver, physical therapist, or athletic trainer. Use a heat pack or soak the injury in warm water.  SEEK MEDICAL CARE IF:  Treatment seems to  offer no benefit, or the condition worsens.   Any medications produce adverse side effects.  EXERCISES  RANGE OF MOTION (ROM) AND STRETCHING EXERCISES - Sciatica Most people with sciatic will find that their symptoms worsen with either excessive bending forward (flexion) or arching at the low back (extension). The exercises which will help resolve your symptoms will focus on the opposite motion. Your physician, physical therapist or athletic trainer will help you determine which exercises will be most helpful to resolve your low back pain. Do not complete any exercises without first consulting with your clinician. Discontinue any exercises which worsen your symptoms until you speak to your clinician. If you have pain, numbness or tingling which travels down into your buttocks, leg or foot, the goal of the therapy is for these symptoms to move closer to your back and eventually resolve. Occasionally, these leg symptoms will get better, but your low back pain may worsen; this is typically an indication of progress in your rehabilitation. Be certain to be very alert to any changes in your symptoms and the activities in which you participated in the 24 hours prior to the change. Sharing this information with your clinician will allow him/her to most efficiently treat your condition. These exercises may help you when beginning to rehabilitate your injury. Your symptoms may resolve with or without further involvement from your physician, physical therapist or athletic trainer. While completing these exercises, remember:   Restoring tissue flexibility helps normal motion to return to the joints. This allows healthier, less painful movement and activity.   An effective stretch should be held for at least 30 seconds.   A stretch should never be painful. You should only feel a gentle lengthening or release in the stretched tissue.  FLEXION RANGE OF MOTION AND STRETCHING EXERCISES: STRETCH - Flexion, Single Knee  to Chest   Lie on a firm bed or floor with both legs extended in front of you.   Keeping one leg in contact with the floor, bring your opposite knee to your chest. Hold your leg in place by either grabbing behind your thigh or at your knee.   Pull until you feel a gentle stretch in your low back. Hold __________ seconds.   Slowly release your grasp and repeat the exercise with the opposite side.  Repeat __________ times. Complete this exercise __________ times per day.  STRETCH - Flexion, Double Knee to Chest  Lie on a firm bed or floor with both legs extended in front of you.   Keeping one leg in contact with the floor, bring your opposite knee to your chest.   Tense your stomach muscles to support your back and then lift your other knee to your chest. Hold your legs in place by either grabbing behind your thighs or at your knees.   Pull both knees toward your chest until you feel a gentle stretch in your low back. Hold __________ seconds.   Tense your stomach muscles and slowly return one leg at a time to the floor.  Repeat __________ times. Complete this exercise __________ times per day.  STRETCH - Low Trunk Rotation   Lie on a firm bed or floor. Keeping your legs in front of you, bend your knees so they are both pointed toward   the ceiling and your feet are flat on the floor.   Extend your arms out to the side. This will stabilize your upper body by keeping your shoulders in contact with the floor.   Gently and slowly drop both knees together to one side until you feel a gentle stretch in your low back. Hold for __________ seconds.   Tense your stomach muscles to support your low back as you bring your knees back to the starting position. Repeat the exercise to the other side.  Repeat __________ times. Complete this exercise __________ times per day  EXTENSION RANGE OF MOTION AND FLEXIBILITY EXERCISES: STRETCH - Extension, Prone on Elbows  Lie on your stomach on the floor, a  bed will be too soft. Place your palms about shoulder width apart and at the height of your head.   Place your elbows under your shoulders. If this is too painful, stack pillows under your chest.   Allow your body to relax so that your hips drop lower and make contact more completely with the floor.   Hold this position for __________ seconds.   Slowly return to lying flat on the floor.  Repeat __________ times. Complete this exercise __________ times per day.  RANGE OF MOTION - Extension, Prone Press Ups  Lie on your stomach on the floor, a bed will be too soft. Place your palms about shoulder width apart and at the height of your head.   Keeping your back as relaxed as possible, slowly straighten your elbows while keeping your hips on the floor. You may adjust the placement of your hands to maximize your comfort. As you gain motion, your hands will come more underneath your shoulders.   Hold this position __________ seconds.   Slowly return to lying flat on the floor.  Repeat __________ times. Complete this exercise __________ times per day.  STRENGTHENING EXERCISES - Sciatica  These exercises may help you when beginning to rehabilitate your injury. These exercises should be done near your "sweet spot." This is the neutral, low-back arch, somewhere between fully rounded and fully arched, that is your least painful position. When performed in this safe range of motion, these exercises can be used for people who have either a flexion or extension based injury. These exercises may resolve your symptoms with or without further involvement from your physician, physical therapist or athletic trainer. While completing these exercises, remember:   Muscles can gain both the endurance and the strength needed for everyday activities through controlled exercises.   Complete these exercises as instructed by your physician, physical therapist or athletic trainer. Progress with the resistance and  repetition exercises only as your caregiver advises.   You may experience muscle soreness or fatigue, but the pain or discomfort you are trying to eliminate should never worsen during these exercises. If this pain does worsen, stop and make certain you are following the directions exactly. If the pain is still present after adjustments, discontinue the exercise until you can discuss the trouble with your clinician.  STRENGTHENING - Deep Abdominals, Pelvic Tilt   Lie on a firm bed or floor. Keeping your legs in front of you, bend your knees so they are both pointed toward the ceiling and your feet are flat on the floor.   Tense your lower abdominal muscles to press your low back into the floor. This motion will rotate your pelvis so that your tail bone is scooping upwards rather than pointing at your feet or into the floor.     With a gentle tension and even breathing, hold this position for __________ seconds.  Repeat __________ times. Complete this exercise __________ times per day.  STRENGTHENING - Abdominals, Crunches   Lie on a firm bed or floor. Keeping your legs in front of you, bend your knees so they are both pointed toward the ceiling and your feet are flat on the floor. Cross your arms over your chest.   Slightly tip your chin down without bending your neck.   Tense your abdominals and slowly lift your trunk high enough to just clear your shoulder blades. Lifting higher can put excessive stress on the low back and does not further strengthen your abdominal muscles.   Control your return to the starting position.  Repeat __________ times. Complete this exercise __________ times per day.  STRENGTHENING - Quadruped, Opposite UE/LE Lift  Assume a hands and knees position on a firm surface. Keep your hands under your shoulders and your knees under your hips. You may place padding under your knees for comfort.   Find your neutral spine and gently tense your abdominal muscles so that you  can maintain this position. Your shoulders and hips should form a rectangle that is parallel with the floor and is not twisted.   Keeping your trunk steady, lift your right hand no higher than your shoulder and then your left leg no higher than your hip. Make sure you are not holding your breath. Hold this position __________ seconds.   Continuing to keep your abdominal muscles tense and your back steady, slowly return to your starting position. Repeat with the opposite arm and leg.  Repeat __________ times. Complete this exercise __________ times per day.  STRENGTHENING - Abdominals and Quadriceps, Straight Leg Raise   Lie on a firm bed or floor with both legs extended in front of you.   Keeping one leg in contact with the floor, bend the other knee so that your foot can rest flat on the floor.   Find your neutral spine, and tense your abdominal muscles to maintain your spinal position throughout the exercise.   Slowly lift your straight leg off the floor about 6 inches for a count of 15, making sure to not hold your breath.   Still keeping your neutral spine, slowly lower your leg all the way to the floor.  Repeat this exercise with each leg __________ times. Complete this exercise __________ times per day. POSTURE AND BODY MECHANICS CONSIDERATIONS - Sciatica Keeping correct posture when sitting, standing or completing your activities will reduce the stress put on different body tissues, allowing injured tissues a chance to heal and limiting painful experiences. The following are general guidelines for improved posture. Your physician or physical therapist will provide you with any instructions specific to your needs. While reading these guidelines, remember:  The exercises prescribed by your provider will help you have the flexibility and strength to maintain correct postures.   The correct posture provides the optimal environment for your joints to work. All of your joints have less wear  and tear when properly supported by a spine with good posture. This means you will experience a healthier, less painful body.   Correct posture must be practiced with all of your activities, especially prolonged sitting and standing. Correct posture is as important when doing repetitive low-stress activities (typing) as it is when doing a single heavy-load activity (lifting).  RESTING POSITIONS Consider which positions are most painful for you when choosing a resting position. If you have   pain with flexion-based activities (sitting, bending, stooping, squatting), choose a position that allows you to rest in a less flexed posture. You would want to avoid curling into a fetal position on your side. If your pain worsens with extension-based activities (prolonged standing, working overhead), avoid resting in an extended position such as sleeping on your stomach. Most people will find more comfort when they rest with their spine in a more neutral position, neither too rounded nor too arched. Lying on a non-sagging bed on your side with a pillow between your knees, or on your back with a pillow under your knees will often provide some relief. Keep in mind, being in any one position for a prolonged period of time, no matter how correct your posture, can still lead to stiffness. PROPER SITTING POSTURE In order to minimize stress and discomfort on your spine, you must sit with correct posture Sitting with good posture should be effortless for a healthy body. Returning to good posture is a gradual process. Many people can work toward this most comfortably by using various supports until they have the flexibility and strength to maintain this posture on their own. When sitting with proper posture, your ears will fall over your shoulders and your shoulders will fall over your hips. You should use the back of the chair to support your upper back. Your low back will be in a neutral position, just slightly arched. You may  place a small pillow or folded towel at the base of your low back for support.  When working at a desk, create an environment that supports good, upright posture. Without extra support, muscles fatigue and lead to excessive strain on joints and other tissues. Keep these recommendations in mind: CHAIR:   A chair should be able to slide under your desk when your back makes contact with the back of the chair. This allows you to work closely.   The chair's height should allow your eyes to be level with the upper part of your monitor and your hands to be slightly lower than your elbows.  BODY POSITION  Your feet should make contact with the floor. If this is not possible, use a foot rest.   Keep your ears over your shoulders. This will reduce stress on your neck and low back.  INCORRECT SITTING POSTURES   If you are feeling tired and unable to assume a healthy sitting posture, do not slouch or slump. This puts excessive strain on your back tissues, causing more damage and pain. Healthier options include:   Using more support, like a lumbar pillow.   Switching tasks to something that requires you to be upright or walking.   Talking a brief walk.   Lying down to rest in a neutral-spine position.  PROLONGED STANDING WHILE SLIGHTLY LEANING FORWARD  When completing a task that requires you to lean forward while standing in one place for a long time, place either foot up on a stationary 2-4 inch high object to help maintain the best posture. When both feet are on the ground, the low back tends to lose its slight inward curve. If this curve flattens (or becomes too large), then the back and your other joints will experience too much stress, fatigue more quickly and can cause pain.  CORRECT STANDING POSTURES Proper standing posture should be assumed with all daily activities, even if they only take a few moments, like when brushing your teeth. As in sitting, your ears should fall over your shoulders and    your shoulders should fall over your hips. You should keep a slight tension in your abdominal muscles to brace your spine. Your tailbone should point down to the ground, not behind your body, resulting in an over-extended swayback posture.  INCORRECT STANDING POSTURES  Common incorrect standing postures include a forward head, locked knees and/or an excessive swayback. WALKING Walk with an upright posture. Your ears, shoulders and hips should all line-up. PROLONGED ACTIVITY IN A FLEXED POSITION When completing a task that requires you to bend forward at your waist or lean over a low surface, try to find a way to stabilize 3 of 4 of your limbs. You can place a hand or elbow on your thigh or rest a knee on the surface you are reaching across. This will provide you more stability so that your muscles do not fatigue as quickly. By keeping your knees relaxed, or slightly bent, you will also reduce stress across your low back. CORRECT LIFTING TECHNIQUES DO :   Assume a wide stance. This will provide you more stability and the opportunity to get as close as possible to the object which you are lifting.   Tense your abdominals to brace your spine; then bend at the knees and hips. Keeping your back locked in a neutral-spine position, lift using your leg muscles. Lift with your legs, keeping your back straight.   Test the weight of unknown objects before attempting to lift them.   Try to keep your elbows locked down at your sides in order get the best strength from your shoulders when carrying an object.   Always ask for help when lifting heavy or awkward objects.  INCORRECT LIFTING TECHNIQUES DO NOT:   Lock your knees when lifting, even if it is a small object.   Bend and twist. Pivot at your feet or move your feet when needing to change directions.   Assume that you cannot safely pick up a paperclip without proper posture.  Document Released: 06/26/2005 Document Revised: 03/08/2011 Document  Reviewed: 10/08/2008 [employer] Patient Information 2012 [employer] 

## 2011-09-11 ENCOUNTER — Ambulatory Visit: Payer: Medicaid Other | Admitting: Physical Medicine & Rehabilitation

## 2011-10-16 ENCOUNTER — Ambulatory Visit (HOSPITAL_BASED_OUTPATIENT_CLINIC_OR_DEPARTMENT_OTHER): Payer: Medicaid Other | Admitting: Physical Medicine & Rehabilitation

## 2011-10-16 ENCOUNTER — Encounter: Payer: Self-pay | Admitting: Physical Medicine & Rehabilitation

## 2011-10-16 ENCOUNTER — Encounter: Payer: Medicaid Other | Attending: Physical Medicine & Rehabilitation

## 2011-10-16 VITALS — BP 169/110 | HR 63 | Resp 18 | Ht 70.0 in | Wt 268.4 lb

## 2011-10-16 DIAGNOSIS — M545 Low back pain, unspecified: Secondary | ICD-10-CM | POA: Insufficient documentation

## 2011-10-16 DIAGNOSIS — M79609 Pain in unspecified limb: Secondary | ICD-10-CM | POA: Insufficient documentation

## 2011-10-16 DIAGNOSIS — IMO0002 Reserved for concepts with insufficient information to code with codable children: Secondary | ICD-10-CM

## 2011-10-16 DIAGNOSIS — F17200 Nicotine dependence, unspecified, uncomplicated: Secondary | ICD-10-CM | POA: Insufficient documentation

## 2011-10-16 DIAGNOSIS — I10 Essential (primary) hypertension: Secondary | ICD-10-CM | POA: Insufficient documentation

## 2011-10-16 DIAGNOSIS — M5116 Intervertebral disc disorders with radiculopathy, lumbar region: Secondary | ICD-10-CM

## 2011-10-16 MED ORDER — GABAPENTIN 300 MG PO CAPS: 300.0000 mg | ORAL_CAPSULE | Freq: Four times a day (QID) | ORAL | Status: DC

## 2011-10-16 MED ORDER — TRAMADOL-ACETAMINOPHEN 37.5-325 MG PO TABS: 1.0000 | ORAL_TABLET | ORAL | Status: DC | PRN

## 2011-10-16 NOTE — Patient Instructions (Signed)
Sciatica  Sciatica is a name for lower back pain caused by pressure on the sciatic nerve. The back pain can spread to the buttocks and back of the leg.  HOME CARE    Rest as much as possible.   Only take medicine as told by your doctor.   Apply cold or heat to your back as told by your doctor.   Do not bend, lift, or sit for a long time until your pain is better.   Do not do anything that makes the condition worse.   Start normal activity when the pain is better.   Keep all follow-up visits.  GET HELP RIGHT AWAY IF:    There is more pain.   There is weakness or numbness in the legs.   You cannot control when you poop (bowel movement) or pee (urinate).  MAKE SURE YOU:    Understand these instructions.   Will watch this condition.   Will get help right away if you are not doing well or get worse.  Document Released: 04/04/2008 Document Revised: 06/15/2011 Document Reviewed: 04/04/2008  [employer] Patient Information 2012 [employer].

## 2011-10-16 NOTE — Progress Notes (Signed)
  Subjective:    Patient ID: Christine Callahan, female    DOB: Jan 10, 1976, 36 y.o.   MRN: [id number]  HPI Complains of back pain no significant radiating pain down the lower extremities. Has some dorsal foot pain. Has seen podiatry. The patient reports that podiatrist think it may be back related. Has gone through physical therapy. Continues with a home program. Has not worked since February due to pain. Pain Inventory Average Pain 8 Pain Right Now 8 My pain is constant, sharp and dull  In the last 24 hours, has pain interfered with the following? General activity 6 Relation with others 7 Enjoyment of life 2 What TIME of day is your pain at its worst? evening Sleep (in general) Fair  Pain is worse with: walking, standing and some activites Pain improves with: nothing  Relief from Meds: 0  Mobility walk without assistance how many minutes can you walk? 10 ability to climb steps?  yes do you drive?  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  no  Physicians involved in your care Any changes since last visit?  no Primary care Osei Bonsu      Review of Systems  Constitutional: Positive for diaphoresis and unexpected weight change.       Night sweats, weight gain  Genitourinary: Positive for urgency.  Musculoskeletal: Positive for back pain and gait problem.  All other systems reviewed and are negative.       Objective:   Physical Exam  Constitutional: She is oriented to person, place, and time. She appears well-developed.  HENT:  Head: Normocephalic and atraumatic.  Musculoskeletal:       Lumbar back: She exhibits decreased range of motion, tenderness and pain. She exhibits no bony tenderness, no swelling, no edema and no  deformity.       Lumbar paraspinal tenderness at L4-L5 level  Neurological: She is alert and oriented to person, place, and time. She has normal strength and normal reflexes. A sensory deficit is present.       Straight leg raising test is positive on the left side There is a sensory deficit left L3 L4-L5 and S1 dermatomal distribution  Psychiatric: She has a normal mood and affect. Her behavior is normal.          Assessment & Plan:  1. Low back pain with left lower extremity pain. She has some physical exam findings suggestive of lumbar radiculopathy on the left side. She has failed conservative care including physical therapy. We'll order MRI to further evaluate. Will increase her gabapentin. Will increase Ultracet to see the patient back in 2 weeks to review MRI

## 2011-10-17 ENCOUNTER — Encounter: Payer: Self-pay | Admitting: Physical Medicine & Rehabilitation

## 2011-10-24 ENCOUNTER — Ambulatory Visit (HOSPITAL_BASED_OUTPATIENT_CLINIC_OR_DEPARTMENT_OTHER)
Admission: RE | Admit: 2011-10-24 | Discharge: 2011-10-24 | Disposition: A | Discharge status: Home / Self Care | Payer: Medicaid Other | Source: Ambulatory Visit | Attending: Physical Medicine & Rehabilitation | Admitting: Physical Medicine & Rehabilitation

## 2011-10-24 DIAGNOSIS — M549 Dorsalgia, unspecified: Secondary | ICD-10-CM

## 2011-10-24 DIAGNOSIS — M5137 Other intervertebral disc degeneration, lumbosacral region: Secondary | ICD-10-CM

## 2011-10-24 DIAGNOSIS — M5116 Intervertebral disc disorders with radiculopathy, lumbar region: Secondary | ICD-10-CM

## 2011-10-24 DIAGNOSIS — M79609 Pain in unspecified limb: Secondary | ICD-10-CM

## 2011-10-24 DIAGNOSIS — M51379 Other intervertebral disc degeneration, lumbosacral region without mention of lumbar back pain or lower extremity pain: Secondary | ICD-10-CM | POA: Insufficient documentation

## 2011-10-24 DIAGNOSIS — M48061 Spinal stenosis, lumbar region without neurogenic claudication: Secondary | ICD-10-CM | POA: Insufficient documentation

## 2011-10-24 DIAGNOSIS — M5126 Other intervertebral disc displacement, lumbar region: Secondary | ICD-10-CM | POA: Insufficient documentation

## 2011-11-09 ENCOUNTER — Encounter: Payer: Medicaid Other | Attending: Physical Medicine & Rehabilitation

## 2011-11-09 ENCOUNTER — Encounter: Payer: Self-pay | Admitting: Physical Medicine & Rehabilitation

## 2011-11-09 ENCOUNTER — Ambulatory Visit (HOSPITAL_BASED_OUTPATIENT_CLINIC_OR_DEPARTMENT_OTHER): Payer: Medicaid Other | Admitting: Physical Medicine & Rehabilitation

## 2011-11-09 VITALS — BP 147/90 | HR 99 | Temp 99.4°F | Ht 70.0 in | Wt 269.0 lb

## 2011-11-09 DIAGNOSIS — M545 Low back pain, unspecified: Secondary | ICD-10-CM | POA: Insufficient documentation

## 2011-11-09 DIAGNOSIS — M79609 Pain in unspecified limb: Secondary | ICD-10-CM | POA: Insufficient documentation

## 2011-11-09 DIAGNOSIS — M5126 Other intervertebral disc displacement, lumbar region: Secondary | ICD-10-CM

## 2011-11-09 DIAGNOSIS — M5116 Intervertebral disc disorders with radiculopathy, lumbar region: Secondary | ICD-10-CM

## 2011-11-09 DIAGNOSIS — F17200 Nicotine dependence, unspecified, uncomplicated: Secondary | ICD-10-CM | POA: Insufficient documentation

## 2011-11-09 DIAGNOSIS — I10 Essential (primary) hypertension: Secondary | ICD-10-CM | POA: Insufficient documentation

## 2011-11-09 MED ORDER — ALPRAZOLAM 1 MG PO TABS: 1.0000 mg | ORAL_TABLET | Freq: Once | ORAL | Status: AC

## 2011-11-09 NOTE — Progress Notes (Signed)
Subjective:    Patient ID: Christine Callahan, female    DOB: [birth year], 36 y.o.   MRN: [id number]  HPI  Christine Callahan is a 36 year old female, who gives about a 2-year history of low  back pain. She has had a ED visit in November, 2011 where she was  lifting a TV and strained her back as well as her finger, however, she  states that she had some pain before that time as well. Within the last  6 months, she has had some left dorsal foot pain and sometimes some  numbness and tingling in that foot as well. I ordered physical therapy last visit, we trialed her on some gabapentin and Ultracet. Reviewed x-ray. No significant disc space narrowing in 2011. She has not had any MRI of the lumbar area Clinical Data: 36 year old female with back pain increasing and  radiating to the left foot.  MRI LUMBAR SPINE WITHOUT CONTRAST  Technique: Multiplanar and multiecho pulse sequences of the lumbar  spine were obtained without intravenous contrast.  Comparison: Lumbar radiographs 05/29/2010. CT abdomen and pelvis  07/23/2011.  Findings: Normal lumbar segmentation depicted on the comparison.  Visualized abdominal viscera and paraspinal soft tissues are within  normal limits. Visualized lower thoracic spinal cord is normal  with conus medularis at T12-L1. Normal vertebral height and  alignment. No marrow edema or evidence of acute osseous  abnormality.  T11-T12: Negative.  T12-L1: Negative.  L1-L2: Negative.  L2-L3: Mild facet hypertrophy. Negative disc.  L3-L4: Mild facet hypertrophy. Mild far lateral disc bulging.  Disc is inseparable from the exiting left L3 nerve (series 6 image  16). No spinal stenosis.  L4-L5: Mild facet hypertrophy. Mild disc desiccation and  circumferential disc bulge. Minimal to mild bilateral lateral  recess stenosis is slightly greater on the left. No spinal or  foraminal stenosis.  L5-S1: Disc desiccation. A small broad-based central disc  protrusion with annular tear (series 6  image 29). Mild facet  hypertrophy. Disc material in proximity of the bilateral  descending S1 nerve roots in the lateral recesses. No spinal  stenosis. Related to facet and endplate spurring, there is severe  right and mild to moderate left L5 foraminal stenosis.  IMPRESSION:  1. Mild L3-L4 disc degeneration partially affecting the left L3  foramen and might be a source of left L3 radiculitis.  2. L4-L5 disc degeneration and facet degeneration combining for  minimal to mild lateral recess stenosis which is slightly worse on  the left.  3. Disc and facet degeneration at L5-S1. The disc disease might  be a source for S1 radiculitis while there is multifactorial severe  right and mild-moderate left L5 foraminal stenosis.    Review of Systems  Musculoskeletal: Positive for back pain.  Neurological: Positive for numbness.       Objective:   Physical Exam  Constitutional: She is oriented to person, place, and time. She appears well-developed.  Musculoskeletal:       Lumbar back: She exhibits decreased range of motion and tenderness. She exhibits no deformity and no spasm.       Tenderness to palpation bilaterally at L5-S1 paraspinal muscles  Neurological: She is alert and oriented to person, place, and time. She has normal strength. She displays no atrophy. A sensory deficit is present. She exhibits normal muscle tone.  Reflex Scores:      Patellar reflexes are 1+ on the right side and 1+ on the left side.      Achilles reflexes are 1+ on  the right side and 1+ on the left side.      Decreased sensation in the left L4-L5-S1 dermatomal distribution          Assessment & Plan:  #1. Lumbar stenosis L S1 radiculopathy  Rec S1 transforaminal.  Failing this consider surgical referral Patient is apprehensive about getting an injection. Will give her Xanax 1 mg prior to injection. We discussed her MRI in great detail using a spine model as well as the actual films.

## 2011-11-09 NOTE — Patient Instructions (Addendum)
Need a driver next visit Take the xanax approximately one hour before your appointment

## 2011-11-09 NOTE — Progress Notes (Signed)
  Subjective:    Patient ID: Christine Callahan, female    DOB: [birth year], 36 y.o.   MRN: [id number]  HPI  Pain Inventory Average Pain 9 Pain Right Now 9 My pain is constant and sharp  In the last 24 hours, has pain interfered with the following? General activity 0 Relation with others 0 Enjoyment of life 0 What TIME of day is your pain at its worst? all the time Sleep (in general) Poor  Pain is worse with: walking, bending, standing and some activites Pain improves with: nothing Relief from Meds: 3  Mobility walk without assistance how many minutes can you walk? 5 min ability to climb steps?  yes do you drive?  no  Function not employed: date last employed   Neuro/Psych trouble walking  Prior Studies Any changes since last visit?  no  Physicians involved in your care Any changes since last visit?  no       Review of Systems  Constitutional: Negative.        Sleep apnea  HENT: Negative.   Eyes: Negative.   Respiratory: Negative.   Cardiovascular: Negative.   Gastrointestinal: Negative.   Genitourinary: Negative.   Musculoskeletal: Negative.   Skin: Negative.   Neurological: Negative.   Hematological: Negative.   Psychiatric/Behavioral: Negative.        Objective:   Physical Exam        Assessment & Plan:

## 2011-11-15 ENCOUNTER — Telehealth: Payer: Self-pay | Admitting: Physical Medicine & Rehabilitation

## 2011-11-15 NOTE — Telephone Encounter (Signed)
Please advise. Pt is currently taking Ultracet 1 Q 4hrs prn

## 2011-11-15 NOTE — Telephone Encounter (Signed)
Needs refill on Tramadol.  Would like to increase dose as it is helping some.  Please call.

## 2011-11-16 MED ORDER — TRAMADOL HCL 50 MG PO TABS: 50.0000 mg | ORAL_TABLET | Freq: Four times a day (QID) | ORAL | Status: AC | PRN

## 2011-11-16 NOTE — Telephone Encounter (Signed)
Rx for Tramadol #120

## 2011-11-16 NOTE — Telephone Encounter (Signed)
Pt aware rx has been sent in  

## 2011-11-21 ENCOUNTER — Ambulatory Visit: Payer: Medicaid Other | Admitting: Physical Medicine & Rehabilitation

## 2011-11-28 ENCOUNTER — Ambulatory Visit: Payer: Medicaid Other | Admitting: Physical Medicine & Rehabilitation

## 2011-12-21 ENCOUNTER — Other Ambulatory Visit: Payer: Self-pay

## 2011-12-21 MED ORDER — TRAMADOL HCL 50 MG PO TABS: 50.0000 mg | ORAL_TABLET | Freq: Four times a day (QID) | ORAL | Status: DC | PRN

## 2011-12-29 ENCOUNTER — Encounter: Payer: Medicaid Other | Attending: Physical Medicine & Rehabilitation

## 2011-12-29 ENCOUNTER — Encounter: Payer: Self-pay | Admitting: Physical Medicine & Rehabilitation

## 2011-12-29 ENCOUNTER — Ambulatory Visit (HOSPITAL_BASED_OUTPATIENT_CLINIC_OR_DEPARTMENT_OTHER): Payer: Medicaid Other | Admitting: Physical Medicine & Rehabilitation

## 2011-12-29 VITALS — BP 164/104 | HR 67 | Resp 14 | Ht 70.0 in | Wt 279.0 lb

## 2011-12-29 DIAGNOSIS — F17200 Nicotine dependence, unspecified, uncomplicated: Secondary | ICD-10-CM | POA: Insufficient documentation

## 2011-12-29 DIAGNOSIS — I10 Essential (primary) hypertension: Secondary | ICD-10-CM | POA: Insufficient documentation

## 2011-12-29 DIAGNOSIS — M545 Low back pain, unspecified: Secondary | ICD-10-CM | POA: Insufficient documentation

## 2011-12-29 DIAGNOSIS — M25579 Pain in unspecified ankle and joints of unspecified foot: Secondary | ICD-10-CM

## 2011-12-29 DIAGNOSIS — M79609 Pain in unspecified limb: Secondary | ICD-10-CM | POA: Insufficient documentation

## 2011-12-29 MED ORDER — TRAMADOL HCL 50 MG PO TABS: 100.0000 mg | ORAL_TABLET | Freq: Three times a day (TID) | ORAL | Status: AC | PRN

## 2011-12-29 MED ORDER — GABAPENTIN 300 MG PO CAPS: 300.0000 mg | ORAL_CAPSULE | Freq: Four times a day (QID) | ORAL | Status: DC

## 2011-12-29 NOTE — Progress Notes (Signed)
  Subjective:    Patient ID: Christine Callahan, female    DOB: 12-04-1975, 36 y.o.   MRN: [id number]  HPI Has left foot pain. Sees a podiatrist. Reviewed MRI. Has significant foraminal stenosis at L5-S1 on the right side and only mild to moderate on the left side. Has not had an EMG study.Pain Inventory Average Pain 7 Pain Right Now 7 My pain is sharp, burning and dull  In the last 24 hours, has pain interfered with the following? General activity 3 Relation with others 2 Enjoyment of life 0 What TIME of day is your pain at its worst? daytime Sleep (in general) Poor  Pain is worse with: walking, bending, sitting and standing Pain improves with: rest Relief from Meds: 3  Mobility walk without assistance how many minutes can you walk? 5 ability to climb steps?  yes do you drive?  no  Function not employed: date last employed  I need assistance with the following:  household duties  Neuro/Psych weakness trouble walking  Prior Studies Any changes since last visit?  no  Physicians involved in your care Any changes since last visit?  no   Family History  Problem Relation Age of Onset  . Lupus Mother   . Seizures Father    History   Social History  . Marital Status: Divorced    Spouse Name: N/A    Number of Children: N/A  . Years of Education: N/A   Social History Main Topics  . Smoking status: Current Everyday Smoker -- 0.5 packs/day for 20 years    Types: Cigarettes  . Smokeless tobacco: Never Used  . Alcohol Use: Yes  . Drug Use: No  . Sexually Active: None   Other Topics Concern  . None   Social History Narrative  . None   Past Surgical History  Procedure Date  . Abdominal hysterectomy   . Cholecystectomy    Past Medical History  Diagnosis Date  . Hypertension   . GERD (gastroesophageal reflux disease)    BP 164/104  Pulse 67  Resp 14  Ht 5\' 10"  (1.778 m)  Wt 279 lb (126.554 kg)  BMI 40.03 kg/m2  SpO2 99%     Review of Systems    Musculoskeletal: Positive for back pain.  Neurological: Positive for weakness.       Objective:   Physical Exam  Morbidly obese Left foot without tenderness to palpation in the toes as well as the foot and ankle area. Full range of motion without pain. Sensory exam is normal. Deep tendon reflexes are normal in both lower extremity strength is normal in both lower extremities Mood and affect are appropriate although she expresses frustration about her left leg      Assessment & Plan:  1. Back and left lower extremity pain not clearly radicular. Will check EMG NCV. I do not think narcotic analgesics are indicated in this situation. We'll continue tramadol but increase to 2 tablets 3 times per day.

## 2011-12-29 NOTE — Patient Instructions (Signed)
Increase tramadol to 2 tablets 3 times per day We will do I nerve study next visit in approximately one month

## 2012-10-14 ENCOUNTER — Ambulatory Visit: Payer: Self-pay | Admitting: Podiatry

## 2012-10-21 ENCOUNTER — Ambulatory Visit: Payer: Self-pay | Admitting: Podiatry

## 2012-10-22 ENCOUNTER — Ambulatory Visit: Payer: Self-pay | Admitting: Podiatry

## 2012-12-31 ENCOUNTER — Telehealth: Payer: Self-pay

## 2012-12-31 NOTE — Telephone Encounter (Signed)
Has not been seen for year please schedule with Christine Callahan for recheck

## 2012-12-31 NOTE — Telephone Encounter (Signed)
Patient called requesting tramadol refill to [facility].  Please advise.

## 2013-01-01 NOTE — Telephone Encounter (Signed)
Tried to contact patient.  First call was dropped and second call she was not at the location but they would tell her to call.

## 2013-01-21 ENCOUNTER — Other Ambulatory Visit: Payer: Self-pay

## 2013-01-21 MED ORDER — TRAMADOL HCL 50 MG PO TABS: 100.0000 mg | ORAL_TABLET | Freq: Three times a day (TID) | ORAL | Status: DC

## 2013-04-02 ENCOUNTER — Telehealth: Payer: Self-pay

## 2013-04-02 NOTE — Telephone Encounter (Signed)
Patient has not been seen in over a year in the clinic. contacted patient to let her know she would need to make an appt. Patient did not seem to want to make a appt. just wanted a refill for the tramadol.

## 2013-04-02 NOTE — Telephone Encounter (Signed)
Patient is requesting a refill on Tramadol

## 2013-07-10 DIAGNOSIS — A549 Gonococcal infection, unspecified: Secondary | ICD-10-CM

## 2013-07-10 HISTORY — DX: Gonococcal infection, unspecified: A54.9

## 2013-07-11 ENCOUNTER — Emergency Department (HOSPITAL_BASED_OUTPATIENT_CLINIC_OR_DEPARTMENT_OTHER)
Admission: EM | Admit: 2013-07-11 | Discharge: 2013-07-11 | Disposition: A | Discharge status: Home / Self Care | Payer: Medicaid Other | Attending: Emergency Medicine | Admitting: Emergency Medicine

## 2013-07-11 ENCOUNTER — Encounter (HOSPITAL_BASED_OUTPATIENT_CLINIC_OR_DEPARTMENT_OTHER): Payer: Self-pay | Admitting: Emergency Medicine

## 2013-07-11 DIAGNOSIS — Z792 Long term (current) use of antibiotics: Secondary | ICD-10-CM | POA: Insufficient documentation

## 2013-07-11 DIAGNOSIS — N760 Acute vaginitis: Secondary | ICD-10-CM | POA: Insufficient documentation

## 2013-07-11 DIAGNOSIS — Z79899 Other long term (current) drug therapy: Secondary | ICD-10-CM | POA: Insufficient documentation

## 2013-07-11 DIAGNOSIS — R350 Frequency of micturition: Secondary | ICD-10-CM | POA: Insufficient documentation

## 2013-07-11 DIAGNOSIS — F17200 Nicotine dependence, unspecified, uncomplicated: Secondary | ICD-10-CM | POA: Insufficient documentation

## 2013-07-11 DIAGNOSIS — E119 Type 2 diabetes mellitus without complications: Secondary | ICD-10-CM | POA: Insufficient documentation

## 2013-07-11 DIAGNOSIS — B9689 Other specified bacterial agents as the cause of diseases classified elsewhere: Secondary | ICD-10-CM | POA: Insufficient documentation

## 2013-07-11 DIAGNOSIS — R300 Dysuria: Secondary | ICD-10-CM | POA: Insufficient documentation

## 2013-07-11 DIAGNOSIS — M545 Low back pain, unspecified: Secondary | ICD-10-CM | POA: Insufficient documentation

## 2013-07-11 DIAGNOSIS — A499 Bacterial infection, unspecified: Secondary | ICD-10-CM | POA: Insufficient documentation

## 2013-07-11 DIAGNOSIS — K219 Gastro-esophageal reflux disease without esophagitis: Secondary | ICD-10-CM | POA: Insufficient documentation

## 2013-07-11 DIAGNOSIS — I10 Essential (primary) hypertension: Secondary | ICD-10-CM | POA: Insufficient documentation

## 2013-07-11 HISTORY — DX: Type 2 diabetes mellitus without complications: E11.9

## 2013-07-11 LAB — WET PREP, GENITAL
Trich, Wet Prep: NONE SEEN
YEAST WET PREP: NONE SEEN

## 2013-07-11 LAB — URINALYSIS, ROUTINE W REFLEX MICROSCOPIC
BILIRUBIN URINE: NEGATIVE
Glucose, UA: NEGATIVE mg/dL
Hgb urine dipstick: NEGATIVE
Ketones, ur: NEGATIVE mg/dL
Leukocytes, UA: NEGATIVE
Nitrite: NEGATIVE
Protein, ur: NEGATIVE mg/dL
SPECIFIC GRAVITY, URINE: 1.023 (ref 1.005–1.030)
UROBILINOGEN UA: 1 mg/dL (ref 0.0–1.0)
pH: 5.5 (ref 5.0–8.0)

## 2013-07-11 MED ORDER — HYDROCODONE-ACETAMINOPHEN 5-325 MG PO TABS
2.0000 | ORAL_TABLET | Freq: Once | ORAL | Status: AC
  Administered 2013-07-11: 2 via ORAL
  Filled 2013-07-11: qty 2

## 2013-07-11 MED ORDER — HYDROCODONE-ACETAMINOPHEN 5-325 MG PO TABS: 1.0000 | ORAL_TABLET | ORAL | Status: DC | PRN

## 2013-07-11 MED ORDER — METRONIDAZOLE 500 MG PO TABS: 500.0000 mg | ORAL_TABLET | Freq: Two times a day (BID) | ORAL | Status: DC

## 2013-07-11 MED ORDER — PHENAZOPYRIDINE HCL 95 MG PO TABS: 95.0000 mg | ORAL_TABLET | Freq: Three times a day (TID) | ORAL | Status: DC | PRN

## 2013-07-11 NOTE — Discharge Instructions (Signed)
Bacterial Vaginosis Bacterial vaginosis (BV) is a vaginal infection where the normal balance of bacteria in the vagina is disrupted. The normal balance is then replaced by an overgrowth of certain bacteria. There are several different kinds of bacteria that can cause BV. BV is the most common vaginal infection in women of childbearing age. CAUSES   The cause of BV is not fully understood. BV develops when there is an increase or imbalance of harmful bacteria.  Some activities or behaviors can upset the normal balance of bacteria in the vagina and put women at increased risk including:  Having a new sex partner or multiple sex partners.  Douching.  Using an intrauterine device (IUD) for contraception.  It is not clear what role sexual activity plays in the development of BV. However, women that have never had sexual intercourse are rarely infected with BV. Women do not get BV from toilet seats, bedding, swimming pools or from touching objects around them.  SYMPTOMS   Grey vaginal discharge.  A fish-like odor with discharge, especially after sexual intercourse.  Itching or burning of the vagina and vulva.  Burning or pain with urination.  Some women have no signs or symptoms at all. DIAGNOSIS  Your caregiver must examine the vagina for signs of BV. Your caregiver will perform lab tests and look at the sample of vaginal fluid through a microscope. They will look for bacteria and abnormal cells (clue cells), a pH test higher than 4.5, and a positive amine test all associated with BV.  RISKS AND COMPLICATIONS   Pelvic inflammatory disease (PID).  Infections following gynecology surgery.  Developing HIV.  Developing herpes virus. TREATMENT  Sometimes BV will clear up without treatment. However, all women with symptoms of BV should be treated to avoid complications, especially if gynecology surgery is planned. Female partners generally do not need to be treated. However, BV may spread  between female sex partners so treatment is helpful in preventing a recurrence of BV.   BV may be treated with antibiotics. The antibiotics come in either pill or vaginal cream forms. Either can be used with nonpregnant or pregnant women, but the recommended dosages differ. These antibiotics are not harmful to the baby.  BV can recur after treatment. If this happens, a second round of antibiotics will often be prescribed.  Treatment is important for pregnant women. If not treated, BV can cause a premature delivery, especially for a pregnant woman who had a premature birth in the past. All pregnant women who have symptoms of BV should be checked and treated.  For chronic reoccurrence of BV, treatment with a type of prescribed gel vaginally twice a week is helpful. HOME CARE INSTRUCTIONS   Finish all medication as directed by your caregiver.  Do not have sex until treatment is completed.  Tell your sexual partner that you have a vaginal infection. They should see their caregiver and be treated if they have problems, such as a mild rash or itching.  Practice safe sex. Use condoms. Only have 1 sex partner. PREVENTION  Basic prevention steps can help reduce the risk of upsetting the natural balance of bacteria in the vagina and developing BV:  Do not have sexual intercourse (be abstinent).  Do not douche.  Use all of the medicine prescribed for treatment of BV, even if the signs and symptoms go away.  Tell your sex partner if you have BV. That way, they can be treated, if needed, to prevent reoccurrence. SEEK MEDICAL CARE IF:     Your symptoms are not improving after 3 days of treatment.  You have increased discharge, pain, or fever. MAKE SURE YOU:   Understand these instructions.  Will watch your condition.  Will get help right away if you are not doing well or get worse. FOR MORE INFORMATION  Division of STD Prevention (DSTDP), Centers for Disease Control and Prevention:  www.cdc.gov/std American [employer] Association (ASHA): www.ashastd.org  Document Released: 06/26/2005 Document Revised: 09/18/2011 Document Reviewed: 02/05/2013 [employer] Patient Information 2014 [employer].  

## 2013-07-11 NOTE — ED Notes (Signed)
Pt. Reports she has a ride home before RN gave meds ordered by EDP

## 2013-07-11 NOTE — ED Provider Notes (Signed)
TIME SEEN: 7:00 PM  CHIEF COMPLAINT: Abdominal Pain  HPI:  Ky Moskowitz [name] is a 38 y.o. female with a h/o HTN, GERD, and DM who presents to the Emergency Department complaining of suprapubic abdominal pain, lower back pain, urinary frequency, and dysuria onset one month ago. She was seen by her PCP one month ago for the same, was told her UA was negative. She has a h/o partial hysterectomy 3 years ago and reports dysuria intermittently since although she keeps testing negative for UTI. She has been going to a urologist. She has an appointment with her OB GYN, Dr. Neva Seat on Jan 12th.  She denies fevers, chills, nausea, vomiting, diarrhea, blood in her urine, vaginal discharge or bleeding. She has a h/o chlamydia years ago. She had a normal cystoscopy in 2013.   PCP - Osei A Bonsu, DO  ROS: See HPI Constitutional: no fever  Eyes: no drainage  ENT: no runny nose   Cardiovascular:  no chest pain  Resp: no SOB  GI: no vomiting GU: dysuria, frequency Integumentary: no rash  Allergy: no hives  Musculoskeletal: no leg swelling  Neurological: no slurred speech ROS otherwise negative  PAST MEDICAL HISTORY/PAST SURGICAL HISTORY:  Past Medical History  Diagnosis Date  . Hypertension   . GERD (gastroesophageal reflux disease)   . Diabetes mellitus without complication     MEDICATIONS:  Prior to Admission medications   Medication Sig Start Date End Date Taking? Authorizing Provider  ATENOLOL PO Take by mouth.   Yes Historical Provider, MD  LISINOPRIL PO Take by mouth.   Yes Historical Provider, MD  amLODipine (NORVASC) 10 MG tablet Take 10 mg by mouth daily.    Historical Provider, MD  buPROPion (WELLBUTRIN XL) 300 MG 24 hr tablet Take 300 mg by mouth daily.    Historical Provider, MD  carisoprodol (SOMA) 350 MG tablet Take 350 mg by mouth 4 (four) times daily as needed.    Historical Provider, MD  ciprofloxacin (CIPRO) 500 MG tablet Take 500 mg by mouth 2 (two) times daily.    Historical  Provider, MD  gabapentin (NEURONTIN) 300 MG capsule Take 1 capsule (300 mg total) by mouth 4 (four) times daily. 12/29/11 12/28/12  Erick Colace, MD  losartan-hydrochlorothiazide (HYZAAR) 100-12.5 MG per tablet Take 1 tablet by mouth daily.    Historical Provider, MD  metoprolol succinate (TOPROL-XL) 25 MG 24 hr tablet Take 25 mg by mouth daily.    Historical Provider, MD  metroNIDAZOLE (FLAGYL) 500 MG tablet Take 500 mg by mouth 2 (two) times daily.    Historical Provider, MD  Multiple Vitamin (MULITIVITAMIN WITH MINERALS) TABS Take 1 tablet by mouth daily.    Historical Provider, MD  Omeprazole (PRILOSEC PO) Take 1 capsule by mouth daily.    Historical Provider, MD  omeprazole (PRILOSEC) 20 MG capsule Take 20 mg by mouth daily.    Historical Provider, MD  phentermine 37.5 MG capsule Take 37.5 mg by mouth every morning.    Historical Provider, MD  pravastatin (PRAVACHOL) 20 MG tablet Take 20 mg by mouth daily.    Historical Provider, MD  PRESCRIPTION MEDICATION Take 1 tablet by mouth daily. Blood pressure medication    Historical Provider, MD  traMADol (ULTRAM) 50 MG tablet Take 2 tablets (100 mg total) by mouth 3 (three) times daily. 01/21/13   Erick Colace, MD    ALLERGIES:  No Known Allergies  SOCIAL HISTORY:  History  Substance Use Topics  . Smoking status: Current Every  Day Smoker -- 0.50 packs/day for 20 years    Types: Cigarettes  . Smokeless tobacco: Never Used  . Alcohol Use: Yes    FAMILY HISTORY: Family History  Problem Relation Age of Onset  . Lupus Mother   . Seizures Father     EXAM: BP 111/64  Pulse 82  Temp(Src) 98.3 F (36.8 C) (Oral)  Resp 20  Ht 5\' 10"  (1.778 m)  Wt 265 lb (120.203 kg)  BMI 38.02 kg/m2  SpO2 95% CONSTITUTIONAL: Alert and oriented and responds appropriately to questions. Well-appearing; well-nourished HEAD: Normocephalic EYES: Conjunctivae clear, PERRL ENT: normal nose; no rhinorrhea; moist mucous membranes; pharynx without  lesions noted NECK: Supple, no meningismus, no LAD  CARD: RRR; S1 and S2 appreciated; no murmurs, no clicks, no rubs, no gallops RESP: Normal chest excursion without splinting or tachypnea; breath sounds clear and equal bilaterally; no wheezes, no rhonchi, no rales,  ABD/GI: Normal bowel sounds; non-distended; soft, non-tender, no rebound, no guarding GU: Normal external genitalia, no vaginal bleeding, normal-appearing cervix, no adnexal tenderness or fullness, no cervical motion tenderness, thick, white vaginal discharge BACK:  The back appears normal and is non-tender to palpation, there is no CVA tenderness EXT: Normal ROM in all joints; non-tender to palpation; no edema; normal capillary refill; no cyanosis    SKIN: Normal color for age and race; warm NEURO: Moves all extremities equally PSYCH: The patient's mood and manner are appropriate. Grooming and personal hygiene are appropriate.  MEDICAL DECISION MAKING: Patient with dysuria, urinary frequency and suprapubic pain for several weeks has been intermittent for 3 years. She is hemodynamically stable. Her abdominal exam is benign. Urine shows no sign of infection. We'll perform pelvic exam with cultures.  ED PROGRESS: Patient with benign pelvic exam. Wet prep and gonorrhea and Chlamydia cultures pending. No concern for PID, TOA, torsion, ovarian cysts.   9:09 PM  Pt's wet prep shows moderate clue cells but no Trichomonas or yeast. Will discharge home with Flagyl and pain medication. She has OB/GYN followup. Given return precautions. Patient verbalizes understanding and is comfortable with plan.    Layla MawKristen N Aiken Withem, DO 07/11/13 2110

## 2013-07-11 NOTE — ED Notes (Signed)
Abdominal pain. Dysuria. She was seen by her MD a month ago for same and is no better.

## 2013-07-13 LAB — GC/CHLAMYDIA PROBE AMP
CT PROBE, AMP APTIMA: NEGATIVE
GC PROBE AMP APTIMA: NEGATIVE

## 2013-08-11 ENCOUNTER — Ambulatory Visit: Payer: Self-pay | Admitting: Podiatry

## 2013-09-17 ENCOUNTER — Ambulatory Visit: Payer: Self-pay | Admitting: Podiatry

## 2013-09-18 ENCOUNTER — Emergency Department (HOSPITAL_BASED_OUTPATIENT_CLINIC_OR_DEPARTMENT_OTHER)
Admission: EM | Admit: 2013-09-18 | Discharge: 2013-09-18 | Disposition: A | Discharge status: Home / Self Care | Payer: Medicaid Other | Attending: Emergency Medicine | Admitting: Emergency Medicine

## 2013-09-18 ENCOUNTER — Encounter (HOSPITAL_BASED_OUTPATIENT_CLINIC_OR_DEPARTMENT_OTHER): Payer: Self-pay | Admitting: Emergency Medicine

## 2013-09-18 DIAGNOSIS — R10A1 Flank pain, right side: Secondary | ICD-10-CM

## 2013-09-18 DIAGNOSIS — K219 Gastro-esophageal reflux disease without esophagitis: Secondary | ICD-10-CM | POA: Insufficient documentation

## 2013-09-18 DIAGNOSIS — N760 Acute vaginitis: Secondary | ICD-10-CM | POA: Insufficient documentation

## 2013-09-18 DIAGNOSIS — Z792 Long term (current) use of antibiotics: Secondary | ICD-10-CM | POA: Insufficient documentation

## 2013-09-18 DIAGNOSIS — R109 Unspecified abdominal pain: Secondary | ICD-10-CM

## 2013-09-18 DIAGNOSIS — I10 Essential (primary) hypertension: Secondary | ICD-10-CM | POA: Insufficient documentation

## 2013-09-18 DIAGNOSIS — E119 Type 2 diabetes mellitus without complications: Secondary | ICD-10-CM | POA: Insufficient documentation

## 2013-09-18 DIAGNOSIS — A499 Bacterial infection, unspecified: Secondary | ICD-10-CM | POA: Insufficient documentation

## 2013-09-18 DIAGNOSIS — B9689 Other specified bacterial agents as the cause of diseases classified elsewhere: Secondary | ICD-10-CM

## 2013-09-18 DIAGNOSIS — F17200 Nicotine dependence, unspecified, uncomplicated: Secondary | ICD-10-CM | POA: Insufficient documentation

## 2013-09-18 DIAGNOSIS — Z87440 Personal history of urinary (tract) infections: Secondary | ICD-10-CM | POA: Insufficient documentation

## 2013-09-18 DIAGNOSIS — Z79899 Other long term (current) drug therapy: Secondary | ICD-10-CM | POA: Insufficient documentation

## 2013-09-18 LAB — URINE MICROSCOPIC-ADD ON

## 2013-09-18 LAB — URINALYSIS, ROUTINE W REFLEX MICROSCOPIC
Bilirubin Urine: NEGATIVE
Glucose, UA: >1000 mg/dL — AB
Hgb urine dipstick: NEGATIVE
Ketones, ur: NEGATIVE mg/dL
Leukocytes, UA: NEGATIVE
NITRITE: NEGATIVE
PH: 6 (ref 5.0–8.0)
Protein, ur: 100 mg/dL — AB
SPECIFIC GRAVITY, URINE: 1.02 (ref 1.005–1.030)
UROBILINOGEN UA: 0.2 mg/dL (ref 0.0–1.0)

## 2013-09-18 LAB — WET PREP, GENITAL
Trich, Wet Prep: NONE SEEN
Yeast Wet Prep HPF POC: NONE SEEN

## 2013-09-18 MED ORDER — METRONIDAZOLE 500 MG PO TABS
500.0000 mg | ORAL_TABLET | Freq: Once | ORAL | Status: AC
  Administered 2013-09-18: 500 mg via ORAL
  Filled 2013-09-18: qty 1

## 2013-09-18 MED ORDER — HYDROCODONE-ACETAMINOPHEN 5-325 MG PO TABS
1.0000 | ORAL_TABLET | Freq: Once | ORAL | Status: AC
  Administered 2013-09-18: 1 via ORAL
  Filled 2013-09-18: qty 1

## 2013-09-18 MED ORDER — METRONIDAZOLE 500 MG PO TABS: 500.0000 mg | ORAL_TABLET | Freq: Two times a day (BID) | ORAL | Status: DC

## 2013-09-18 MED ORDER — HYDROCODONE-ACETAMINOPHEN 5-325 MG PO TABS: 1.0000 | ORAL_TABLET | Freq: Four times a day (QID) | ORAL | Status: DC | PRN

## 2013-09-18 MED ORDER — FLUCONAZOLE 100 MG PO TABS
ORAL_TABLET | ORAL | Status: AC
  Filled 2013-09-18: qty 1

## 2013-09-18 MED ORDER — FLUCONAZOLE 50 MG PO TABS
150.0000 mg | ORAL_TABLET | Freq: Once | ORAL | Status: AC
  Administered 2013-09-18: 150 mg via ORAL
  Filled 2013-09-18 (×2): qty 1

## 2013-09-18 NOTE — ED Notes (Signed)
Lab called to state they have not received the GC/Ch probe.  Order found to discontinue.

## 2013-09-18 NOTE — ED Notes (Signed)
MD at bedside. 

## 2013-09-18 NOTE — ED Notes (Signed)
C/o dysuria, low back pain, and urinary frequency

## 2013-09-18 NOTE — ED Provider Notes (Signed)
CSN: [id number]     Arrival date & time 09/18/13  0251 History   First MD Initiated Contact with Patient 09/18/13 0301     Chief Complaint  Patient presents with  . Urinary Frequency     (Consider location/radiation/quality/duration/timing/severity/associated sxs/prior Treatment) HPI This is a 38 year old female with a one-week history of urinary frequency and burning with urination "real bad". Symptoms acutely worsened yesterday. She denies associated vaginal discharge, vaginal bleeding, genital rash or lesions or abnormal odor. The symptoms are similar to previous urinary tract infections although worse. She has had chills but is unaware of having a fever. She denies nausea or vomiting. She is having left suprapubic pain as well as right flank pain. She has not taken any medications for this.  Past Medical History  Diagnosis Date  . Hypertension   . GERD (gastroesophageal reflux disease)   . Diabetes mellitus without complication    Past Surgical History  Procedure Laterality Date  . Abdominal hysterectomy    . Cholecystectomy     Family History  Problem Relation Age of Onset  . Lupus Mother   . Seizures Father    History  Substance Use Topics  . Smoking status: Current Every Day Smoker -- 0.50 packs/day for 20 years    Types: Cigarettes  . Smokeless tobacco: Never Used  . Alcohol Use: No   OB History   Grav Para Term Preterm Abortions TAB SAB Ect Mult Living                 Review of Systems  All other systems reviewed and are negative.    Allergies  Review of patient's allergies indicates no known allergies.  Home Medications   Current Outpatient Rx  Name  Route  Sig  Dispense  Refill  . amLODipine (NORVASC) 10 MG tablet   Oral   Take 10 mg by mouth daily.         . ATENOLOL PO   Oral   Take by mouth.         . gabapentin (NEURONTIN) 300 MG capsule   Oral   Take 1 capsule (300 mg total) by mouth 4 (four) times daily.   120 capsule   1   .  LISINOPRIL PO   Oral   Take by mouth.         . metFORMIN (GLUCOPHAGE) 500 MG tablet   Oral   Take by mouth daily with breakfast.         . Omeprazole (PRILOSEC PO)   Oral   Take 1 capsule by mouth daily.         Marland Kitchen. buPROPion (WELLBUTRIN XL) 300 MG 24 hr tablet   Oral   Take 300 mg by mouth daily.         . carisoprodol (SOMA) 350 MG tablet   Oral   Take 350 mg by mouth 4 (four) times daily as needed.         . ciprofloxacin (CIPRO) 500 MG tablet   Oral   Take 500 mg by mouth 2 (two) times daily.         Marland Kitchen. HYDROcodone-acetaminophen (NORCO/VICODIN) 5-325 MG per tablet   Oral   Take 1 tablet by mouth every 4 (four) hours as needed.   15 tablet   0   . losartan-hydrochlorothiazide (HYZAAR) 100-12.5 MG per tablet   Oral   Take 1 tablet by mouth daily.         . metoprolol succinate (TOPROL-XL) 25  MG 24 hr tablet   Oral   Take 25 mg by mouth daily.         . metroNIDAZOLE (FLAGYL) 500 MG tablet   Oral   Take 500 mg by mouth 2 (two) times daily.         . metroNIDAZOLE (FLAGYL) 500 MG tablet   Oral   Take 1 tablet (500 mg total) by mouth 2 (two) times daily. Do not drink alcohol with this medication.   14 tablet   0   . Multiple Vitamin (MULITIVITAMIN WITH MINERALS) TABS   Oral   Take 1 tablet by mouth daily.         Marland Kitchen omeprazole (PRILOSEC) 20 MG capsule   Oral   Take 20 mg by mouth daily.         . phenazopyridine (PYRIDIUM) 95 MG tablet   Oral   Take 1 tablet (95 mg total) by mouth 3 (three) times daily as needed for pain.   10 tablet   0   . phentermine 37.5 MG capsule   Oral   Take 37.5 mg by mouth every morning.         . pravastatin (PRAVACHOL) 20 MG tablet   Oral   Take 20 mg by mouth daily.         Marland Kitchen PRESCRIPTION MEDICATION   Oral   Take 1 tablet by mouth daily. Blood pressure medication         . traMADol (ULTRAM) 50 MG tablet   Oral   Take 2 tablets (100 mg total) by mouth 3 (three) times daily.   30 tablet    1    BP 118/73  Pulse 78  Temp(Src) 98.6 F (37 C) (Oral)  Resp 20  Ht 5' 9.5" (1.765 m)  Wt 260 lb (117.935 kg)  BMI 37.86 kg/m2  SpO2 100%  Physical Exam General: Well-developed, obese female in no acute distress; appearance consistent with age of record HENT: normocephalic; atraumatic Eyes: pupils equal, round and reactive to light; extraocular muscles intact Neck: supple Heart: regular rate and rhythm Lungs: clear to auscultation bilaterally Abdomen: soft; nondistended; mild left suprapubic tenderness; no masses or hepatosplenomegaly; bowel sounds present GU: Right CVA tenderness; whitish vulva discharge Extremities: No deformity; full range of motion; pulses normal Neurologic: Awake, alert and oriented; motor function intact in all extremities and symmetric; no facial droop Skin: Warm and dry Psychiatric: Flat affect    ED Course  Procedures (including critical care time)    MDM   Nursing notes and vitals signs, including pulse oximetry, reviewed.  Summary of this visit's results, reviewed by myself:  Labs:  Results for orders placed during the hospital encounter of 09/18/13 (from the past 24 hour(s))  URINALYSIS, ROUTINE W REFLEX MICROSCOPIC     Status: Abnormal   Collection Time    09/18/13  2:55 AM      Result Value Ref Range   Color, Urine YELLOW  YELLOW   APPearance CLOUDY (*) CLEAR   Specific Gravity, Urine 1.020  1.005 - 1.030   pH 6.0  5.0 - 8.0   Glucose, UA >1000 (*) NEGATIVE mg/dL   Hgb urine dipstick NEGATIVE  NEGATIVE   Bilirubin Urine NEGATIVE  NEGATIVE   Ketones, ur NEGATIVE  NEGATIVE mg/dL   Protein, ur 409 (*) NEGATIVE mg/dL   Urobilinogen, UA 0.2  0.0 - 1.0 mg/dL   Nitrite NEGATIVE  NEGATIVE   Leukocytes, UA NEGATIVE  NEGATIVE  URINE MICROSCOPIC-ADD ON  Status: Abnormal   Collection Time    09/18/13  2:55 AM      Result Value Ref Range   Squamous Epithelial / LPF MANY (*) RARE   WBC, UA 0-2  <3 WBC/hpf   RBC / HPF 0-2  <3  RBC/hpf   Bacteria, UA FEW (*) RARE   Urine-Other MUCOUS PRESENT    WET PREP, GENITAL     Status: Abnormal   Collection Time    09/18/13  3:20 AM      Result Value Ref Range   Yeast Wet Prep HPF POC NONE SEEN  NONE SEEN   Trich, Wet Prep NONE SEEN  NONE SEEN   Clue Cells Wet Prep HPF POC MODERATE (*) NONE SEEN   WBC, Wet Prep HPF POC MODERATE (*) NONE SEEN   3:38 AM Urine sent for culture as patient states she has had negative urinalyses that later showed UTI on culture. We'll treat for bacterial vaginosis. The cause of her back pain is not clearly related to her dysuria and may represent exacerbation of her previous episodic back pain.       Hanley Seamen, MD 09/18/13 [phone]

## 2013-09-19 ENCOUNTER — Ambulatory Visit (INDEPENDENT_AMBULATORY_CARE_PROVIDER_SITE_OTHER): Payer: Medicaid Other | Admitting: Podiatry

## 2013-09-19 ENCOUNTER — Encounter: Payer: Self-pay | Admitting: Podiatry

## 2013-09-19 VITALS — BP 112/70 | HR 70 | Ht 69.5 in | Wt 275.0 lb

## 2013-09-19 DIAGNOSIS — E1149 Type 2 diabetes mellitus with other diabetic neurological complication: Secondary | ICD-10-CM

## 2013-09-19 DIAGNOSIS — E1140 Type 2 diabetes mellitus with diabetic neuropathy, unspecified: Secondary | ICD-10-CM | POA: Insufficient documentation

## 2013-09-19 DIAGNOSIS — M25579 Pain in unspecified ankle and joints of unspecified foot: Secondary | ICD-10-CM

## 2013-09-19 DIAGNOSIS — M216X9 Other acquired deformities of unspecified foot: Secondary | ICD-10-CM

## 2013-09-19 DIAGNOSIS — M79606 Pain in leg, unspecified: Secondary | ICD-10-CM

## 2013-09-19 DIAGNOSIS — E1142 Type 2 diabetes mellitus with diabetic polyneuropathy: Secondary | ICD-10-CM

## 2013-09-19 DIAGNOSIS — L600 Ingrowing nail: Secondary | ICD-10-CM | POA: Insufficient documentation

## 2013-09-19 DIAGNOSIS — L851 Acquired keratosis [keratoderma] palmaris et plantaris: Secondary | ICD-10-CM

## 2013-09-19 DIAGNOSIS — B351 Tinea unguium: Secondary | ICD-10-CM

## 2013-09-19 DIAGNOSIS — M79609 Pain in unspecified limb: Secondary | ICD-10-CM

## 2013-09-19 LAB — URINE CULTURE
COLONY COUNT: NO GROWTH
CULTURE: NO GROWTH

## 2013-09-19 NOTE — Patient Instructions (Signed)
Seen for painful feet.  Noted for having Diabetic Neuropathy, ingrown nail and tight Achilles tendon.  Today all nails and calluses debrided. Reviewed Stretch exercise for tight Achilles tendon. May benefit from ingrown nail surgery, Stretch exercise, Neuremedy Nerve vitamin, and Custom Orthotics. May return for ingrown nail surgery or for routine diabetic care.

## 2013-09-19 NOTE — Progress Notes (Signed)
Subjective: 38 year old female presents complaining of big toe on right foot hurts and calluses on bottom of both feet hurt.  She is on her feet about 9 hours a day.  Stated that sh has been diagnosed for diabetes since last year and is under control.  Review of Systems - General ROS: negative for - chills, fatigue, fever, night sweats, sleep disturbance or weight loss Ophthalmic ROS: Appointment with eye doctor for having head aches. ENT ROS: negative Endocrine ROS: negative Breast ROS: negative for breast lumps Respiratory ROS: no cough, shortness of breath, or wheezing Cardiovascular ROS: no chest pain or dyspnea on exertion Gastrointestinal ROS: no abdominal pain, change in bowel habits, or black or bloody stools Genito-Urinary ROS: no dysuria, trouble voiding, or hematuria Musculoskeletal ROS: negative Neurological ROS: no TIA or stroke symptoms Dermatological ROS: Severe callus problem on both feet that are painful.   Objective: Dermatologic: Ingrown thick nail right great toe medial border. Hypertrophic nails x 10.  Thick broad callus under lateral column about at the base of the 5th Metatarsal R>L. This is painful. No open skin lesions or acute changes noted.  Vascular: All pedal pulses are palpable. No edema or erythema noted.  Orthopedic:  Tight Achilles tendon R>L. Rearfoot varus. Forefoot varus severe. Neurologic:  Subjective tingling and pricking sensations both feet L>R.  Fail to response to Monofilament sensory testing.   Assessment: Diabetic neuropathy. Plantar keratosis bilateral, symptomatic. Mycotic nails x 10. Symptomatic ingrown nail right hallux medial border without infection. Ankle equinus bilateral. Forefoot varus bilateral.   Plan: Debrided all calluses, nails. Instructed to stretch Achilles tendon.  Continue to Neurontin and explained the availability of NeuRemedy.  Custom orthotics explained.  May benefit from ingrown nail surgery on right  great toe medial border.

## 2013-10-03 ENCOUNTER — Ambulatory Visit: Payer: Medicaid Other | Admitting: Podiatry

## 2013-10-31 ENCOUNTER — Emergency Department (HOSPITAL_BASED_OUTPATIENT_CLINIC_OR_DEPARTMENT_OTHER)
Admission: EM | Admit: 2013-10-31 | Discharge: 2013-10-31 | Disposition: A | Discharge status: Home / Self Care | Payer: Medicaid Other | Attending: Emergency Medicine | Admitting: Emergency Medicine

## 2013-10-31 ENCOUNTER — Encounter (HOSPITAL_BASED_OUTPATIENT_CLINIC_OR_DEPARTMENT_OTHER): Payer: Self-pay | Admitting: Emergency Medicine

## 2013-10-31 DIAGNOSIS — S335XXA Sprain of ligaments of lumbar spine, initial encounter: Secondary | ICD-10-CM | POA: Insufficient documentation

## 2013-10-31 DIAGNOSIS — I10 Essential (primary) hypertension: Secondary | ICD-10-CM | POA: Insufficient documentation

## 2013-10-31 DIAGNOSIS — Y92410 Unspecified street and highway as the place of occurrence of the external cause: Secondary | ICD-10-CM | POA: Insufficient documentation

## 2013-10-31 DIAGNOSIS — S4980XA Other specified injuries of shoulder and upper arm, unspecified arm, initial encounter: Secondary | ICD-10-CM | POA: Insufficient documentation

## 2013-10-31 DIAGNOSIS — Y9389 Activity, other specified: Secondary | ICD-10-CM | POA: Insufficient documentation

## 2013-10-31 DIAGNOSIS — F17200 Nicotine dependence, unspecified, uncomplicated: Secondary | ICD-10-CM | POA: Insufficient documentation

## 2013-10-31 DIAGNOSIS — S39012A Strain of muscle, fascia and tendon of lower back, initial encounter: Secondary | ICD-10-CM

## 2013-10-31 DIAGNOSIS — Z792 Long term (current) use of antibiotics: Secondary | ICD-10-CM | POA: Insufficient documentation

## 2013-10-31 DIAGNOSIS — Z79899 Other long term (current) drug therapy: Secondary | ICD-10-CM | POA: Insufficient documentation

## 2013-10-31 DIAGNOSIS — E119 Type 2 diabetes mellitus without complications: Secondary | ICD-10-CM | POA: Insufficient documentation

## 2013-10-31 DIAGNOSIS — S46909A Unspecified injury of unspecified muscle, fascia and tendon at shoulder and upper arm level, unspecified arm, initial encounter: Secondary | ICD-10-CM | POA: Insufficient documentation

## 2013-10-31 DIAGNOSIS — K219 Gastro-esophageal reflux disease without esophagitis: Secondary | ICD-10-CM | POA: Insufficient documentation

## 2013-10-31 MED ORDER — HYDROCODONE-ACETAMINOPHEN 5-325 MG PO TABS: ORAL_TABLET | ORAL | Status: DC

## 2013-10-31 MED ORDER — CYCLOBENZAPRINE HCL 10 MG PO TABS: 10.0000 mg | ORAL_TABLET | Freq: Two times a day (BID) | ORAL | Status: DC | PRN

## 2013-10-31 NOTE — ED Notes (Signed)
Pt. Reports she was on her way to work and now cant go to work.

## 2013-10-31 NOTE — Discharge Instructions (Signed)
Lumbosacral Strain Lumbosacral strain is a strain of any of the parts that make up your lumbosacral vertebrae. Your lumbosacral vertebrae are the bones that make up the lower third of your backbone. Your lumbosacral vertebrae are held together by muscles and tough, fibrous tissue (ligaments).  CAUSES  A sudden blow to your back can cause lumbosacral strain. Also, anything that causes an excessive stretch of the muscles in the low back can cause this strain. This is typically seen when people exert themselves strenuously, fall, lift heavy objects, bend, or crouch repeatedly. RISK FACTORS  Physically demanding work.  Participation in pushing or pulling sports or sports that require sudden twist of the back (tennis, golf, baseball).  Weight lifting.  Excessive lower back curvature.  Forward-tilted pelvis.  Weak back or abdominal muscles or both.  Tight hamstrings. SIGNS AND SYMPTOMS  Lumbosacral strain may cause pain in the area of your injury or pain that moves (radiates) down your leg.  DIAGNOSIS Your health care provider can often diagnose lumbosacral strain through a physical exam. In some cases, you may need tests such as X-ray exams.  TREATMENT  Treatment for your lower back injury depends on many factors that your clinician will have to evaluate. However, most treatment will include the use of anti-inflammatory medicines. HOME CARE INSTRUCTIONS   Avoid hard physical activities (tennis, racquetball, waterskiing) if you are not in proper physical condition for it. This may aggravate or create problems.  If you have a back problem, avoid sports requiring sudden body movements. Swimming and walking are generally safer activities.  Maintain good posture.  Maintain a healthy weight.  For acute conditions, you may put ice on the injured area.  Put ice in a plastic bag.  Place a towel between your skin and the bag.  Leave the ice on for 20 minutes, 2 3 times a day.  When the  low back starts healing, stretching and strengthening exercises may be recommended. SEEK MEDICAL CARE IF:  Your back pain is getting worse.  You experience severe back pain not relieved with medicines. SEEK IMMEDIATE MEDICAL CARE IF:   You have numbness, tingling, weakness, or problems with the use of your arms or legs.  There is a change in bowel or bladder control.  You have increasing pain in any area of the body, including your belly (abdomen).  You notice shortness of breath, dizziness, or feel faint.  You feel sick to your stomach (nauseous), are throwing up (vomiting), or become sweaty.  You notice discoloration of your toes or legs, or your feet get very cold. MAKE SURE YOU:   Understand these instructions.  Will watch your condition.  Will get help right away if you are not doing well or get worse. Document Released: 04/05/2005 Document Revised: 04/16/2013 Document Reviewed: 02/12/2013 [employer] Patient Information 2014 [employer].  
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
## 2013-11-03 NOTE — ED Provider Notes (Signed)
Medical screening examination/treatment/procedure(s) were performed by non-physician practitioner and as supervising physician I was immediately available for consultation/collaboration.   EKG Interpretation None        Charles B. Sheldon, MD 11/03/13 0656 

## 2013-12-19 ENCOUNTER — Encounter: Payer: Self-pay | Admitting: Podiatry

## 2013-12-19 ENCOUNTER — Ambulatory Visit (INDEPENDENT_AMBULATORY_CARE_PROVIDER_SITE_OTHER): Payer: Medicaid Other | Admitting: Podiatry

## 2013-12-19 VITALS — BP 136/86 | HR 65

## 2013-12-19 DIAGNOSIS — E1142 Type 2 diabetes mellitus with diabetic polyneuropathy: Secondary | ICD-10-CM

## 2013-12-19 DIAGNOSIS — B351 Tinea unguium: Secondary | ICD-10-CM

## 2013-12-19 DIAGNOSIS — E1140 Type 2 diabetes mellitus with diabetic neuropathy, unspecified: Secondary | ICD-10-CM

## 2013-12-19 DIAGNOSIS — M79609 Pain in unspecified limb: Secondary | ICD-10-CM

## 2013-12-19 DIAGNOSIS — M25579 Pain in unspecified ankle and joints of unspecified foot: Secondary | ICD-10-CM

## 2013-12-19 DIAGNOSIS — L600 Ingrowing nail: Secondary | ICD-10-CM

## 2013-12-19 DIAGNOSIS — M79606 Pain in leg, unspecified: Secondary | ICD-10-CM

## 2013-12-19 DIAGNOSIS — E1149 Type 2 diabetes mellitus with other diabetic neurological complication: Secondary | ICD-10-CM

## 2013-12-19 MED ORDER — HYDROCODONE-ACETAMINOPHEN 5-325 MG PO TABS: 1.0000 | ORAL_TABLET | ORAL | Status: DC | PRN

## 2013-12-19 NOTE — Patient Instructions (Signed)
Ingrown nail surgery done on right great toe both borders. Keep the bandage dry and return next Tuesday.

## 2013-12-19 NOTE — Progress Notes (Signed)
Subjective: Patient presents with pain on right great toe nail on both borders. All other nails thick and hypertrophic and need trimming.   Objective: Dystrophic ingrown nail right great to both borders. Mycotic nails x 10.  Assessment: Chronic painful ingrown nail right great toe both borders. Mycotic nails x 10. Pain in lower limb.  Procedure done: All nails debrided prior to the next procedure.  Excision nail and matrix both borders right great toe.  Affected toe was anesthetized with total 5ml mixture of 50/50 0.5% Marcaine plain and 1% Xylocaine plain. Affected nail border was reflected with a nail elevator. Nail borders and matrix tissues were excised with #15 blade.  Area was flushed with normal sterile saline. Noted free of nail matrix tissue at both borders right hallucal nail.  Patient is to keep the dressing clean and dry till next week.  Return in 1 week for follow up.

## 2013-12-23 ENCOUNTER — Ambulatory Visit (INDEPENDENT_AMBULATORY_CARE_PROVIDER_SITE_OTHER): Payer: Medicaid Other | Admitting: Podiatry

## 2013-12-23 ENCOUNTER — Encounter: Payer: Self-pay | Admitting: Podiatry

## 2013-12-23 VITALS — BP 136/87 | HR 62

## 2013-12-23 DIAGNOSIS — L600 Ingrowing nail: Secondary | ICD-10-CM

## 2013-12-23 MED ORDER — AMOXICILLIN-POT CLAVULANATE 500-125 MG PO TABS: 1.0000 | ORAL_TABLET | Freq: Three times a day (TID) | ORAL | Status: DC

## 2013-12-23 NOTE — Patient Instructions (Signed)
Post op wound after nail surgery. Doing well. Take antibiotics as prescribed.

## 2013-12-23 NOTE — Progress Notes (Signed)
Follow up after ingrown nail surgery.  Wound is clean and dry under dry dressing. No redness or edema noted.  Positive of small spot of drainage at medial border under steri strip. Wound cleansed and Betadine dressing applied. Rx. Augmentin prescribed.

## 2014-03-24 ENCOUNTER — Emergency Department (HOSPITAL_BASED_OUTPATIENT_CLINIC_OR_DEPARTMENT_OTHER)
Admission: EM | Admit: 2014-03-24 | Discharge: 2014-03-24 | Disposition: A | Discharge status: Home / Self Care | Payer: Medicaid Other | Attending: Emergency Medicine | Admitting: Emergency Medicine

## 2014-03-24 ENCOUNTER — Encounter (HOSPITAL_BASED_OUTPATIENT_CLINIC_OR_DEPARTMENT_OTHER): Payer: Self-pay | Admitting: Emergency Medicine

## 2014-03-24 DIAGNOSIS — Z3202 Encounter for pregnancy test, result negative: Secondary | ICD-10-CM | POA: Insufficient documentation

## 2014-03-24 DIAGNOSIS — K219 Gastro-esophageal reflux disease without esophagitis: Secondary | ICD-10-CM | POA: Insufficient documentation

## 2014-03-24 DIAGNOSIS — I10 Essential (primary) hypertension: Secondary | ICD-10-CM | POA: Diagnosis not present

## 2014-03-24 DIAGNOSIS — Z79899 Other long term (current) drug therapy: Secondary | ICD-10-CM | POA: Insufficient documentation

## 2014-03-24 DIAGNOSIS — Z792 Long term (current) use of antibiotics: Secondary | ICD-10-CM | POA: Insufficient documentation

## 2014-03-24 DIAGNOSIS — F17200 Nicotine dependence, unspecified, uncomplicated: Secondary | ICD-10-CM | POA: Diagnosis not present

## 2014-03-24 DIAGNOSIS — E119 Type 2 diabetes mellitus without complications: Secondary | ICD-10-CM | POA: Insufficient documentation

## 2014-03-24 DIAGNOSIS — R110 Nausea: Secondary | ICD-10-CM | POA: Insufficient documentation

## 2014-03-24 LAB — COMPREHENSIVE METABOLIC PANEL
ALT: 18 U/L (ref 0–35)
ANION GAP: 12 (ref 5–15)
AST: 20 U/L (ref 0–37)
Albumin: 3.7 g/dL (ref 3.5–5.2)
Alkaline Phosphatase: 60 U/L (ref 39–117)
BUN: 15 mg/dL (ref 6–23)
CALCIUM: 10 mg/dL (ref 8.4–10.5)
CO2: 25 meq/L (ref 19–32)
Chloride: 103 mEq/L (ref 96–112)
Creatinine, Ser: 1 mg/dL (ref 0.50–1.10)
GFR calc non Af Amer: 71 mL/min — ABNORMAL LOW (ref >90)
GFR, EST AFRICAN AMERICAN: 82 mL/min — AB (ref >90)
GLUCOSE: 90 mg/dL (ref 70–99)
Potassium: 3.9 mEq/L (ref 3.7–5.3)
SODIUM: 140 meq/L (ref 137–147)
Total Bilirubin: 0.3 mg/dL (ref 0.3–1.2)
Total Protein: 7.9 g/dL (ref 6.0–8.3)

## 2014-03-24 LAB — CBC
HEMATOCRIT: 42.4 % (ref 36.0–46.0)
HEMOGLOBIN: 14 g/dL (ref 12.0–15.0)
MCH: 29.2 pg (ref 26.0–34.0)
MCHC: 33 g/dL (ref 30.0–36.0)
MCV: 88.3 fL (ref 78.0–100.0)
Platelets: 187 10*3/uL (ref 150–400)
RBC: 4.8 MIL/uL (ref 3.87–5.11)
RDW: 14.4 % (ref 11.5–15.5)
WBC: 8.5 10*3/uL (ref 4.0–10.5)

## 2014-03-24 LAB — URINALYSIS, ROUTINE W REFLEX MICROSCOPIC
BILIRUBIN URINE: NEGATIVE
GLUCOSE, UA: NEGATIVE mg/dL
HGB URINE DIPSTICK: NEGATIVE
Ketones, ur: NEGATIVE mg/dL
LEUKOCYTES UA: NEGATIVE
Nitrite: NEGATIVE
PH: 5.5 (ref 5.0–8.0)
PROTEIN: NEGATIVE mg/dL
Specific Gravity, Urine: 1.022 (ref 1.005–1.030)
Urobilinogen, UA: 0.2 mg/dL (ref 0.0–1.0)

## 2014-03-24 LAB — CBG MONITORING, ED: Glucose-Capillary: 83 mg/dL (ref 70–99)

## 2014-03-24 LAB — PREGNANCY, URINE: Preg Test, Ur: NEGATIVE

## 2014-03-24 MED ORDER — ONDANSETRON 8 MG PO TBDP
8.0000 mg | ORAL_TABLET | ORAL | Status: AC
  Administered 2014-03-24: 8 mg via ORAL
  Filled 2014-03-24: qty 1

## 2014-03-24 MED ORDER — PROMETHAZINE HCL 25 MG PO TABS: 25.0000 mg | ORAL_TABLET | Freq: Four times a day (QID) | ORAL | Status: DC | PRN

## 2014-03-24 MED ORDER — PROMETHAZINE HCL 25 MG/ML IJ SOLN
25.0000 mg | Freq: Once | INTRAMUSCULAR | Status: AC
  Administered 2014-03-24: 25 mg via INTRAMUSCULAR
  Filled 2014-03-24: qty 1

## 2014-03-24 NOTE — ED Notes (Signed)
Pt. Reports she had smoked sausage and a boiled egg last night at midnight.

## 2014-03-24 NOTE — Discharge Instructions (Signed)
Return to the ED with any concerns including vomiting and not able to keep down liquids, chest pain, fever/chills, decreased level of alertness/lethargy, or any other alarming symptoms

## 2014-03-24 NOTE — ED Provider Notes (Signed)
CSN: [id number]     Arrival date & time 03/24/14  1516 History   First MD Initiated Contact with Patient 03/24/14 1531     Chief Complaint  Patient presents with  . Nausea     (Consider location/radiation/quality/duration/timing/severity/associated sxs/prior Treatment) HPI Pt presenting with c/o feeling weak and nauseated.  She states symptoms began 2 weeks ago with feeling more weak and fatigued than normal.  Nausea began yesterday.  She has not vomited, but has had decreased po intake due to nausea.  One episode of diarrhea yesterday.  No abdominal pain.  No known fever.  No headache or difficulty breathing.  There are no other associated systemic symptoms, there are no other alleviating or modifying factors.  No chest pain or shortness of breath  Past Medical History  Diagnosis Date  . Hypertension   . GERD (gastroesophageal reflux disease)   . Diabetes mellitus without complication    Past Surgical History  Procedure Laterality Date  . Abdominal hysterectomy    . Cholecystectomy     Family History  Problem Relation Age of Onset  . Lupus Mother   . Seizures Father    History  Substance Use Topics  . Smoking status: Current Every Day Smoker -- 0.50 packs/day for 20 years    Types: Cigarettes  . Smokeless tobacco: Never Used  . Alcohol Use: No   OB History   Grav Para Term Preterm Abortions TAB SAB Ect Mult Living                 Review of Systems ROS reviewed and all otherwise negative except for mentioned in HPI    Allergies  Review of patient's allergies indicates no known allergies.  Home Medications   Prior to Admission medications   Medication Sig Start Date End Date Taking? Authorizing Provider  amLODipine (NORVASC) 10 MG tablet Take 10 mg by mouth daily.    Historical Provider, MD  amoxicillin-clavulanate (AUGMENTIN) 500-125 MG per tablet Take 1 tablet (500 mg total) by mouth 3 (three) times daily. 12/23/13   Myeong Sheard, DPM  atenolol-chlorthalidone  (TENORETIC) 100-25 MG per tablet Take by mouth.    Historical Provider, MD  buPROPion (WELLBUTRIN XL) 300 MG 24 hr tablet Take 300 mg by mouth daily.    Historical Provider, MD  carisoprodol (SOMA) 350 MG tablet Take 350 mg by mouth 4 (four) times daily as needed.    Historical Provider, MD  cyclobenzaprine (FLEXERIL) 10 MG tablet Take 1 tablet (10 mg total) by mouth 2 (two) times daily as needed for muscle spasms. 10/31/13   Tammy L. Triplett, PA-C  gabapentin (NEURONTIN) 600 MG tablet Take 600 mg by mouth 2 (two) times daily.    Historical Provider, MD  HYDROcodone-acetaminophen (NORCO/VICODIN) 5-325 MG per tablet Take 1 tablet by mouth every 4 (four) hours as needed. 12/19/13   Myeong Sheard, DPM  LISINOPRIL PO Take by mouth.    Historical Provider, MD  losartan-hydrochlorothiazide (HYZAAR) 100-12.5 MG per tablet Take 1 tablet by mouth daily.    Historical Provider, MD  metFORMIN (GLUCOPHAGE) 500 MG tablet Take by mouth daily with breakfast.    Historical Provider, MD  metoprolol succinate (TOPROL-XL) 25 MG 24 hr tablet Take 25 mg by mouth daily.    Historical Provider, MD  metroNIDAZOLE (FLAGYL) 500 MG tablet Take 1 tablet (500 mg total) by mouth 2 (two) times daily. Do not drink alcohol with this medication. 07/11/13   [name], DO  Multiple Vitamin (MULITIVITAMIN WITH MINERALS)  TABS Take 1 tablet by mouth daily.    Historical Provider, MD  Omeprazole (PRILOSEC PO) Take 1 capsule by mouth daily.    Historical Provider, MD  pantoprazole (PROTONIX) 40 MG tablet Take 40 mg by mouth.    Historical Provider, MD  phenazopyridine (PYRIDIUM) 95 MG tablet Take 1 tablet (95 mg total) by mouth 3 (three) times daily as needed for pain. 07/11/13   [name], DO  phentermine 37.5 MG capsule Take 37.5 mg by mouth every morning.    Historical Provider, MD  pravastatin (PRAVACHOL) 20 MG tablet Take 20 mg by mouth daily.    Historical Provider, MD  PRESCRIPTION MEDICATION Take 1 tablet by mouth daily. Blood  pressure medication    Historical Provider, MD  promethazine (PHENERGAN) 25 MG tablet Take 1 tablet (25 mg total) by mouth every 6 (six) hours as needed for nausea or vomiting. 03/24/14   Ethelda Chick, MD  traMADol (ULTRAM) 50 MG tablet Take 2 tablets (100 mg total) by mouth 3 (three) times daily. 01/21/13   Erick Colace, MD   BP 138/91  Pulse 57  Temp(Src) 98.9 F (37.2 C) (Oral)  Resp 20  Ht  (1.778 m)  Wt 265 lb (120.203 kg)  BMI 38.02 kg/m2  SpO2 100% Vitals reviewed Physical Exam Physical Examination: General appearance - alert, well appearing, and in no distress Mental status - alert, oriented to person, place, and time Eyes - no conjunctival injection, no scleral icterus Mouth - mucous membranes moist, pharynx normal without lesions Chest - clear to auscultation, no wheezes, rales or rhonchi, symmetric air entry Heart - normal rate, regular rhythm, normal S1, S2, no murmurs, rubs, clicks or gallops Abdomen - soft, nontender, nondistended, no masses or organomegaly, nabs Extremities - peripheral pulses normal, no pedal edema, no clubbing or cyanosis Skin - normal coloration and turgor, no rashes  ED Course  Procedures (including critical care time) Labs Review Labs Reviewed  COMPREHENSIVE METABOLIC PANEL - Abnormal; Notable for the following:    GFR calc non Af Amer 71 (*)    GFR calc Af Amer 82 (*)    All other components within normal limits  URINALYSIS, ROUTINE W REFLEX MICROSCOPIC  PREGNANCY, URINE  CBC  CBG MONITORING, ED    Imaging Review No results found.   EKG Interpretation   Date/Time:  Tuesday March 24 2014 18:15:24 EDT Ventricular Rate:  57 PR Interval:  156 QRS Duration: 86 QT Interval:  458 QTC Calculation: 445 R Axis:   41 Text Interpretation:  Sinus bradycardia Nonspecific T wave abnormality  Abnormal ECG No significant change since last tracing Confirmed by [facility]   MD, Hayward Rylander [phone]) on 03/24/2014 6:52:50 PM      MDM    Final diagnoses:  Nausea    Pt presenting with c/o nausea and decreased energy over the past few days.  Workup including cbc, cmp, ekg, urine were reassuring.  Pt had control of her symptoms after phenergan.  Doubt serious acute emergent process at this time, pt stable for discharge.  Discharged with strict return precautions.  Pt agreeable with plan.    Ethelda Chick, MD 03/24/14 [id number]

## 2014-03-24 NOTE — ED Notes (Signed)
Teaching done with Pt. On checking her blood sugars at home.

## 2014-03-24 NOTE — ED Notes (Signed)
Nausea x 2 days. No energy and chills for a week. States she is a diabetic.

## 2014-04-09 ENCOUNTER — Other Ambulatory Visit (HOSPITAL_BASED_OUTPATIENT_CLINIC_OR_DEPARTMENT_OTHER): Payer: Self-pay | Admitting: Internal Medicine

## 2014-04-09 DIAGNOSIS — R1084 Generalized abdominal pain: Secondary | ICD-10-CM

## 2014-04-10 ENCOUNTER — Ambulatory Visit (HOSPITAL_BASED_OUTPATIENT_CLINIC_OR_DEPARTMENT_OTHER)
Admission: RE | Admit: 2014-04-10 | Discharge: 2014-04-10 | Disposition: A | Discharge status: Home / Self Care | Payer: Medicaid Other | Source: Ambulatory Visit | Attending: Diagnostic Radiology | Admitting: Diagnostic Radiology

## 2014-04-10 DIAGNOSIS — R1084 Generalized abdominal pain: Secondary | ICD-10-CM

## 2014-04-10 DIAGNOSIS — K760 Fatty (change of) liver, not elsewhere classified: Secondary | ICD-10-CM | POA: Insufficient documentation

## 2014-04-10 DIAGNOSIS — R1013 Epigastric pain: Secondary | ICD-10-CM | POA: Diagnosis present

## 2014-05-16 ENCOUNTER — Emergency Department (HOSPITAL_BASED_OUTPATIENT_CLINIC_OR_DEPARTMENT_OTHER)
Admission: EM | Admit: 2014-05-16 | Discharge: 2014-05-16 | Disposition: A | Discharge status: Home / Self Care | Payer: Medicaid Other | Attending: Emergency Medicine | Admitting: Emergency Medicine

## 2014-05-16 ENCOUNTER — Emergency Department (HOSPITAL_BASED_OUTPATIENT_CLINIC_OR_DEPARTMENT_OTHER): Payer: Medicaid Other

## 2014-05-16 ENCOUNTER — Encounter (HOSPITAL_BASED_OUTPATIENT_CLINIC_OR_DEPARTMENT_OTHER): Payer: Self-pay | Admitting: *Deleted

## 2014-05-16 DIAGNOSIS — Z79899 Other long term (current) drug therapy: Secondary | ICD-10-CM | POA: Diagnosis not present

## 2014-05-16 DIAGNOSIS — Z792 Long term (current) use of antibiotics: Secondary | ICD-10-CM | POA: Insufficient documentation

## 2014-05-16 DIAGNOSIS — E119 Type 2 diabetes mellitus without complications: Secondary | ICD-10-CM | POA: Diagnosis not present

## 2014-05-16 DIAGNOSIS — Z90710 Acquired absence of both cervix and uterus: Secondary | ICD-10-CM | POA: Diagnosis not present

## 2014-05-16 DIAGNOSIS — Z9049 Acquired absence of other specified parts of digestive tract: Secondary | ICD-10-CM | POA: Insufficient documentation

## 2014-05-16 DIAGNOSIS — I10 Essential (primary) hypertension: Secondary | ICD-10-CM | POA: Insufficient documentation

## 2014-05-16 DIAGNOSIS — K219 Gastro-esophageal reflux disease without esophagitis: Secondary | ICD-10-CM | POA: Diagnosis not present

## 2014-05-16 DIAGNOSIS — R1032 Left lower quadrant pain: Secondary | ICD-10-CM

## 2014-05-16 DIAGNOSIS — R300 Dysuria: Secondary | ICD-10-CM | POA: Diagnosis present

## 2014-05-16 DIAGNOSIS — Z720 Tobacco use: Secondary | ICD-10-CM | POA: Diagnosis not present

## 2014-05-16 DIAGNOSIS — N83209 Unspecified ovarian cyst, unspecified side: Secondary | ICD-10-CM

## 2014-05-16 DIAGNOSIS — N830 Follicular cyst of ovary: Secondary | ICD-10-CM | POA: Insufficient documentation

## 2014-05-16 LAB — WET PREP, GENITAL
Trich, Wet Prep: NONE SEEN
WBC, Wet Prep HPF POC: NONE SEEN
Yeast Wet Prep HPF POC: NONE SEEN

## 2014-05-16 LAB — URINALYSIS, ROUTINE W REFLEX MICROSCOPIC
Bilirubin Urine: NEGATIVE
GLUCOSE, UA: NEGATIVE mg/dL
Hgb urine dipstick: NEGATIVE
Ketones, ur: NEGATIVE mg/dL
Leukocytes, UA: NEGATIVE
Nitrite: NEGATIVE
PH: 5.5 (ref 5.0–8.0)
Protein, ur: NEGATIVE mg/dL
Specific Gravity, Urine: 1.015 (ref 1.005–1.030)
Urobilinogen, UA: 0.2 mg/dL (ref 0.0–1.0)

## 2014-05-16 MED ORDER — OXYCODONE-ACETAMINOPHEN 5-325 MG PO TABS
2.0000 | ORAL_TABLET | Freq: Once | ORAL | Status: AC
  Administered 2014-05-16: 2 via ORAL
  Filled 2014-05-16: qty 2

## 2014-05-16 MED ORDER — KETOROLAC TROMETHAMINE 30 MG/ML IJ SOLN: 30.0000 mg | Freq: Once | INTRAMUSCULAR | Status: DC

## 2014-05-16 MED ORDER — KETOROLAC TROMETHAMINE 10 MG PO TABS
10.0000 mg | ORAL_TABLET | Freq: Once | ORAL | Status: AC
  Administered 2014-05-16: 10 mg via ORAL
  Filled 2014-05-16: qty 1

## 2014-05-16 MED ORDER — IBUPROFEN 200 MG PO CAPS: 600.0000 mg | ORAL_CAPSULE | Freq: Four times a day (QID) | ORAL | Status: DC | PRN

## 2014-05-16 MED ORDER — OXYCODONE-ACETAMINOPHEN 10-325 MG PO TABS: 1.0000 | ORAL_TABLET | Freq: Four times a day (QID) | ORAL | Status: DC | PRN

## 2014-05-16 MED ORDER — OXYCODONE-ACETAMINOPHEN 5-325 MG PO TABS: 1.0000 | ORAL_TABLET | Freq: Once | ORAL | Status: DC

## 2014-05-16 NOTE — ED Notes (Signed)
Pelvic cart is at the bedside set up and ready for the doctor to use. 

## 2014-05-16 NOTE — Discharge Instructions (Signed)
Please follow up with Dr. Catalina AntiguaPeggy Constant (OBGYN). You have a left hemorrhagic ovarian cyst that will need to be followed.   Please return to the ED if you develop fever, chills, worsening abdominal/back pain, or any other concerning symptoms.

## 2014-05-16 NOTE — ED Notes (Signed)
Patient c/o painful urination that has grown worse over the past two weeks

## 2014-05-16 NOTE — ED Provider Notes (Signed)
CSN: [id number]     Arrival date & time 05/16/14  1432 History   First MD Initiated Contact with Patient 05/16/14 1534     Chief Complaint  Patient presents with  . Dysuria    HPI Ms. Christine Callahan is a 38yo woman w/ PMHx of HTN and Type 2 DM who presents to the ED with left lower quadrant abdominal pain and left lower back pain. She reports the pain has been going on for the last 2 weeks, but has worsened over the last week. Patient states the pain starts in her left back and then radiates to the front of her left abdomen. She describes the pain as 9/10 in severity, stabbing, and nothing relieves her pain. She reports she is sexually active and has noticed dyspareunia for the last week. She denies using protection during intercourse. She is unsure if she has dysuria, but denies fever, chills, nausea, vomiting, diarrhea, vaginal discharge, and abnormal vaginal bleeding.   Per records, patient had a CT Abdomen/Pelvis in 07/2011 which showed a left adnexal mass which warranted further evaluation. She has not had further work up of this mass.   Past Medical History  Diagnosis Date  . Hypertension   . GERD (gastroesophageal reflux disease)   . Diabetes mellitus without complication    Past Surgical History  Procedure Laterality Date  . Abdominal hysterectomy    . Cholecystectomy     Family History  Problem Relation Age of Onset  . Lupus Mother   . Seizures Father    History  Substance Use Topics  . Smoking status: Current Every Day Smoker -- 0.50 packs/day for 20 years    Types: Cigarettes  . Smokeless tobacco: Never Used  . Alcohol Use: No   OB History    No data available     Review of Systems General: Denies night sweats, changes in weight, changes in appetite HEENT: Denies headaches, ear pain, changes in vision, rhinorrhea, sore throat CV: Denies CP, palpitations, SOB, orthopnea Pulm: Denies SOB, cough, wheezing GI: Denies constipation, melena, hematochezia GU: See HPI Msk:  Denies muscle cramps, joint pains Neuro: Denies weakness, numbness, tingling Skin: Denies rashes, bruising  Allergies  Review of patient's allergies indicates no known allergies.  Home Medications   Prior to Admission medications   Medication Sig Start Date End Date Taking? Authorizing Provider  amLODipine (NORVASC) 10 MG tablet Take 10 mg by mouth daily.    Historical Provider, MD  amoxicillin-clavulanate (AUGMENTIN) 500-125 MG per tablet Take 1 tablet (500 mg total) by mouth 3 (three) times daily. 12/23/13   Myeong Sheard, DPM  atenolol-chlorthalidone (TENORETIC) 100-25 MG per tablet Take by mouth.    Historical Provider, MD  buPROPion (WELLBUTRIN XL) 300 MG 24 hr tablet Take 300 mg by mouth daily.    Historical Provider, MD  carisoprodol (SOMA) 350 MG tablet Take 350 mg by mouth 4 (four) times daily as needed.    Historical Provider, MD  cyclobenzaprine (FLEXERIL) 10 MG tablet Take 1 tablet (10 mg total) by mouth 2 (two) times daily as needed for muscle spasms. 10/31/13   Tammy L. Triplett, PA-C  gabapentin (NEURONTIN) 600 MG tablet Take 600 mg by mouth 2 (two) times daily.    Historical Provider, MD  HYDROcodone-acetaminophen (NORCO/VICODIN) 5-325 MG per tablet Take 1 tablet by mouth every 4 (four) hours as needed. 12/19/13   Myeong Sheard, DPM  LISINOPRIL PO Take by mouth.    Historical Provider, MD  losartan-hydrochlorothiazide (HYZAAR) 100-12.5 MG per tablet Take  1 tablet by mouth daily.    Historical Provider, MD  metFORMIN (GLUCOPHAGE) 500 MG tablet Take by mouth daily with breakfast.    Historical Provider, MD  metoprolol succinate (TOPROL-XL) 25 MG 24 hr tablet Take 25 mg by mouth daily.    Historical Provider, MD  metroNIDAZOLE (FLAGYL) 500 MG tablet Take 1 tablet (500 mg total) by mouth 2 (two) times daily. Do not drink alcohol with this medication. 07/11/13   [name], DO  Multiple Vitamin (MULITIVITAMIN WITH MINERALS) TABS Take 1 tablet by mouth daily.    Historical Provider,  MD  Omeprazole (PRILOSEC PO) Take 1 capsule by mouth daily.    Historical Provider, MD  pantoprazole (PROTONIX) 40 MG tablet Take 40 mg by mouth.    Historical Provider, MD  phenazopyridine (PYRIDIUM) 95 MG tablet Take 1 tablet (95 mg total) by mouth 3 (three) times daily as needed for pain. 07/11/13   [name], DO  phentermine 37.5 MG capsule Take 37.5 mg by mouth every morning.    Historical Provider, MD  pravastatin (PRAVACHOL) 20 MG tablet Take 20 mg by mouth daily.    Historical Provider, MD  PRESCRIPTION MEDICATION Take 1 tablet by mouth daily. Blood pressure medication    Historical Provider, MD  promethazine (PHENERGAN) 25 MG tablet Take 1 tablet (25 mg total) by mouth every 6 (six) hours as needed for nausea or vomiting. 03/24/14   Ethelda Chick, MD  traMADol (ULTRAM) 50 MG tablet Take 2 tablets (100 mg total) by mouth 3 (three) times daily. 01/21/13   Erick Colace, MD   BP 179/101 mmHg  Pulse 86  Temp(Src) 98.5 F (36.9 C) (Oral)  Resp 20  Ht 5\' 10"  (1.778 m)  Wt 260 lb (117.935 kg)  BMI 37.31 kg/m2  SpO2 98% Physical Exam General: alert, sitting up in bed, NAD HEENT: [place]/AT, EOMI, sclera anicteric, mucus membranes moist CV: RRR, normal S1/S2, no m/g/r Pulm: CTA bilaterally, breaths non-labored Abd: BS+, soft, obese, tenderness to LLQ, no suprapubic tenderness, no rebound or guarding, left CVA tenderness Pelvic: external genitalia has no ulcers or sores present. Cervix has small amount of thin, white discharge. No cervical motion discharge. No masses palpated.   Ext: warm, no edema, moves all Neuro: alert and oriented x 3, CNs II-XII intact, strength 5/5 in upper and lower extremities bilaterally  ED Course  Procedures (including critical care time) Labs Review Labs Reviewed  GC/CHLAMYDIA PROBE AMP  WET PREP, GENITAL  URINALYSIS, ROUTINE W REFLEX MICROSCOPIC  RPR  HIV ANTIBODY (ROUTINE TESTING)    Imaging Review US Transvaginal Non-ob  05/16/2014    CLINICAL DATA:  Pt presents with constant sharp LLQ pain x 1 week. History of ovarian cysts. Uterus removed 4 years ago due to fibroids. No current hormones. No n/v. Also patient states urinary pain. 2 c-sections  EXAM: TRANSABDOMINAL AND TRANSVAGINAL ULTRASOUND OF PELVIS  TECHNIQUE: Both transabdominal and transvaginal ultrasound examinations of the pelvis were performed. Transabdominal technique was performed for global imaging of the pelvis including uterus, ovaries, adnexal regions, and pelvic cul-de-sac. It was necessary to proceed with endovaginal exam following the transabdominal exam to visualize the ovaries to better advantage.  COMPARISON:  CT abdomen and pelvis, 09/21/2012.  FINDINGS: Uterus  Surgically removed.  Right ovary  Measurements: 3.2 cm x 2.0 cm x 1.9 cm. Small peripheral ovarian calcifications. No right adnexal mass.  Left ovary  Measurements: 5 cm x 2.8 cm x 3.2 cm. Complex cysts containing internal echoes.  Cyst measures 3.4 cm x 2.4 cm x 3 cm. Small simple appearing cysts lie adjacent to this. Ovarian tissue draped over this cyst shows normal color Doppler blood flow. Several small echogenic peripheral foci consistent with calcifications as seen on the right. No left adnexal mass.  Other findings  No free fluid.  IMPRESSION: 1. Hemorrhagic left ovarian cyst measuring 3.5 cm. 2. Status post hysterectomy. 3. No pelvic/adnexal masses.  No free fluid.   Electronically Signed   By: Amie Portlandavid  Ormond M.D.   On: 05/16/2014 16:44   [place] Pelvis Complete  05/16/2014   CLINICAL DATA:  Pt presents with constant sharp LLQ pain x 1 week. History of ovarian cysts. Uterus removed 4 years ago due to fibroids. No current hormones. No n/v. Also patient states urinary pain. 2 c-sections  EXAM: TRANSABDOMINAL AND TRANSVAGINAL ULTRASOUND OF PELVIS  TECHNIQUE: Both transabdominal and transvaginal ultrasound examinations of the pelvis were performed. Transabdominal technique was performed for global imaging of the  pelvis including uterus, ovaries, adnexal regions, and pelvic cul-de-sac. It was necessary to proceed with endovaginal exam following the transabdominal exam to visualize the ovaries to better advantage.  COMPARISON:  CT abdomen and pelvis, 09/21/2012.  FINDINGS: Uterus  Surgically removed.  Right ovary  Measurements: 3.2 cm x 2.0 cm x 1.9 cm. Small peripheral ovarian calcifications. No right adnexal mass.  Left ovary  Measurements: 5 cm x 2.8 cm x 3.2 cm. Complex cysts containing internal echoes. Cyst measures 3.4 cm x 2.4 cm x 3 cm. Small simple appearing cysts lie adjacent to this. Ovarian tissue draped over this cyst shows normal color Doppler blood flow. Several small echogenic peripheral foci consistent with calcifications as seen on the right. No left adnexal mass.  Other findings  No free fluid.  IMPRESSION: 1. Hemorrhagic left ovarian cyst measuring 3.5 cm. 2. Status post hysterectomy. 3. No pelvic/adnexal masses.  No free fluid.   Electronically Signed   By: Amie Portlandavid  Ormond M.D.   On: 05/16/2014 16:44     EKG Interpretation None      MDM   Final diagnoses:  Left lower quadrant pain   Patient presenting with left lower quadrant abdominal pain and left back pain that has been ongoing for last 2 weeks, but worsening over last 1 week. She is afebrile, vital signs stable. U/A negative. Likely not appendicitis or diverticulitis given no fever, no rebound tenderness or RLQ tenderness. Patient does have history of left adnexal mass found on CT in 2013, but was not further worked up. Will get pelvic ultrasound to further evaluate mass and do pelvic exam to evaluate for STIs. HIV also ordered.  Pelvic ultrasound shows a left hemorrhagic ovarian cyst. Patient was given Percocet 10-325 mg and Toradol 10 mg. OBGYN called and recommend for patient to call on Monday and set up follow up appointment. Patient was discharged with Percocet 10-325 mg Q6H PRN (30 tablets) and ibuprofen. Patient recommended to come  back to the ED if fever, chills, worsening abdominal pain, or vaginal bleeding develops. Patient understands.     Christine Numberarly Casidee Jann, MD 05/16/14 1826  Christine Shiobert L Beaton, MD 05/17/14 [phone]

## 2014-05-18 ENCOUNTER — Encounter (HOSPITAL_BASED_OUTPATIENT_CLINIC_OR_DEPARTMENT_OTHER): Payer: Self-pay | Admitting: *Deleted

## 2014-05-18 ENCOUNTER — Emergency Department (HOSPITAL_BASED_OUTPATIENT_CLINIC_OR_DEPARTMENT_OTHER)
Admission: EM | Admit: 2014-05-18 | Discharge: 2014-05-18 | Disposition: A | Discharge status: Home / Self Care | Payer: Medicaid Other | Attending: Emergency Medicine | Admitting: Emergency Medicine

## 2014-05-18 DIAGNOSIS — Z90710 Acquired absence of both cervix and uterus: Secondary | ICD-10-CM | POA: Diagnosis not present

## 2014-05-18 DIAGNOSIS — Z79899 Other long term (current) drug therapy: Secondary | ICD-10-CM | POA: Insufficient documentation

## 2014-05-18 DIAGNOSIS — E119 Type 2 diabetes mellitus without complications: Secondary | ICD-10-CM | POA: Diagnosis not present

## 2014-05-18 DIAGNOSIS — N83202 Unspecified ovarian cyst, left side: Secondary | ICD-10-CM

## 2014-05-18 DIAGNOSIS — I10 Essential (primary) hypertension: Secondary | ICD-10-CM | POA: Insufficient documentation

## 2014-05-18 DIAGNOSIS — N830 Follicular cyst of ovary: Secondary | ICD-10-CM | POA: Insufficient documentation

## 2014-05-18 DIAGNOSIS — K219 Gastro-esophageal reflux disease without esophagitis: Secondary | ICD-10-CM | POA: Diagnosis not present

## 2014-05-18 DIAGNOSIS — Z720 Tobacco use: Secondary | ICD-10-CM | POA: Insufficient documentation

## 2014-05-18 DIAGNOSIS — Z792 Long term (current) use of antibiotics: Secondary | ICD-10-CM | POA: Diagnosis not present

## 2014-05-18 DIAGNOSIS — R300 Dysuria: Secondary | ICD-10-CM | POA: Diagnosis not present

## 2014-05-18 DIAGNOSIS — M549 Dorsalgia, unspecified: Secondary | ICD-10-CM | POA: Diagnosis present

## 2014-05-18 LAB — URINALYSIS, ROUTINE W REFLEX MICROSCOPIC
BILIRUBIN URINE: NEGATIVE
Glucose, UA: NEGATIVE mg/dL
Ketones, ur: NEGATIVE mg/dL
Leukocytes, UA: NEGATIVE
NITRITE: NEGATIVE
PH: 5.5 (ref 5.0–8.0)
Protein, ur: NEGATIVE mg/dL
SPECIFIC GRAVITY, URINE: 1.021 (ref 1.005–1.030)
Urobilinogen, UA: 0.2 mg/dL (ref 0.0–1.0)

## 2014-05-18 LAB — URINE MICROSCOPIC-ADD ON

## 2014-05-18 LAB — HIV ANTIBODY (ROUTINE TESTING W REFLEX): HIV 1&2 Ab, 4th Generation: NONREACTIVE

## 2014-05-18 LAB — RPR

## 2014-05-18 NOTE — ED Provider Notes (Addendum)
CSN: [id number]     Arrival date & time 05/18/14  1022 History   First MD Initiated Contact with Patient 05/18/14 1121     Chief Complaint  Patient presents with  . Back Pain     (Consider location/radiation/quality/duration/timing/severity/associated sxs/prior Treatment) HPI [place] of left-sided suprapubic pain radiating to left flank onset 2 weeks ago. Patient seen herefor same complaint on 05/16/2014. Determined to have left-sided complex ovarian cyst. Treated with Percocet. Pain is worse with urination she describes a burning on urination. She admits to nausea no vomiting no fever. No other associated symptoms treated with Percocet with partial relief.no other associated symptoms. She treated herself with Percocet 9:15 AM today with partial relief. She denies vaginal discharge Past Medical History  Diagnosis Date  . Hypertension   . GERD (gastroesophageal reflux disease)   . Diabetes mellitus without complication    Past Surgical History  Procedure Laterality Date  . Abdominal hysterectomy    . Cholecystectomy     Family History  Problem Relation Age of Onset  . Lupus Mother   . Seizures Father    History  Substance Use Topics  . Smoking status: Current Every Day Smoker -- 0.50 packs/day for 20 years    Types: Cigarettes  . Smokeless tobacco: Never Used  . Alcohol Use: No   OB History    No data available     Review of Systems  Genitourinary: Positive for dysuria.  Musculoskeletal: Positive for back pain.  All other systems reviewed and are negative.     Allergies  Review of patient's allergies indicates no known allergies.  Home Medications   Prior to Admission medications   Medication Sig Start Date End Date Taking? Authorizing Provider  amLODipine (NORVASC) 10 MG tablet Take 10 mg by mouth daily.    Historical Provider, MD  amoxicillin-clavulanate (AUGMENTIN) 500-125 MG per tablet Take 1 tablet (500 mg total) by mouth 3 (three) times daily. 12/23/13    Myeong Sheard, DPM  atenolol-chlorthalidone (TENORETIC) 100-25 MG per tablet Take by mouth.    Historical Provider, MD  buPROPion (WELLBUTRIN XL) 300 MG 24 hr tablet Take 300 mg by mouth daily.    Historical Provider, MD  carisoprodol (SOMA) 350 MG tablet Take 350 mg by mouth 4 (four) times daily as needed.    Historical Provider, MD  cyclobenzaprine (FLEXERIL) 10 MG tablet Take 1 tablet (10 mg total) by mouth 2 (two) times daily as needed for muscle spasms. 10/31/13   Tammy L. Triplett, PA-C  gabapentin (NEURONTIN) 600 MG tablet Take 600 mg by mouth 2 (two) times daily.    Historical Provider, MD  HYDROcodone-acetaminophen (NORCO/VICODIN) 5-325 MG per tablet Take 1 tablet by mouth every 4 (four) hours as needed. 12/19/13   Myeong Sheard, DPM  Ibuprofen 200 MG CAPS Take 3 capsules (600 mg total) by mouth every 6 (six) hours as needed. 05/16/14   Rich Numberarly Rivet, MD  LISINOPRIL PO Take by mouth.    Historical Provider, MD  losartan-hydrochlorothiazide (HYZAAR) 100-12.5 MG per tablet Take 1 tablet by mouth daily.    Historical Provider, MD  metFORMIN (GLUCOPHAGE) 500 MG tablet Take by mouth daily with breakfast.    Historical Provider, MD  metoprolol succinate (TOPROL-XL) 25 MG 24 hr tablet Take 25 mg by mouth daily.    Historical Provider, MD  metroNIDAZOLE (FLAGYL) 500 MG tablet Take 1 tablet (500 mg total) by mouth 2 (two) times daily. Do not drink alcohol with this medication. 07/11/13   Layla MawKristen N Ward,  DO  Multiple Vitamin (MULITIVITAMIN WITH MINERALS) TABS Take 1 tablet by mouth daily.    Historical Provider, MD  Omeprazole (PRILOSEC PO) Take 1 capsule by mouth daily.    Historical Provider, MD  oxyCODONE-acetaminophen (PERCOCET) 10-325 MG per tablet Take 1 tablet by mouth every 6 (six) hours as needed for pain. 05/16/14   Rich Number, MD  pantoprazole (PROTONIX) 40 MG tablet Take 40 mg by mouth.    Historical Provider, MD  phenazopyridine (PYRIDIUM) 95 MG tablet Take 1 tablet (95 mg total) by mouth 3  (three) times daily as needed for pain. 07/11/13   [name], DO  phentermine 37.5 MG capsule Take 37.5 mg by mouth every morning.    Historical Provider, MD  pravastatin (PRAVACHOL) 20 MG tablet Take 20 mg by mouth daily.    Historical Provider, MD  PRESCRIPTION MEDICATION Take 1 tablet by mouth daily. Blood pressure medication    Historical Provider, MD  promethazine (PHENERGAN) 25 MG tablet Take 1 tablet (25 mg total) by mouth every 6 (six) hours as needed for nausea or vomiting. 03/24/14   Ethelda Chick, MD  traMADol (ULTRAM) 50 MG tablet Take 2 tablets (100 mg total) by mouth 3 (three) times daily. 01/21/13   Erick Colace, MD   BP 153/93 mmHg  Pulse 72  Temp(Src) 98.7 F (37.1 C) (Oral)  Resp 16  Ht 5\' 10"  (1.778 m)  Wt 260 lb (117.935 kg)  BMI 37.31 kg/m2  SpO2 98% Physical Exam  Constitutional: She appears well-developed and well-nourished.  HENT:  Head: Normocephalic and atraumatic.  Eyes: Conjunctivae are normal. Pupils are equal, round, and reactive to light.  Neck: Neck supple. No tracheal deviation present. No thyromegaly present.  Cardiovascular: Normal rate and regular rhythm.   No murmur heard. Pulmonary/Chest: Effort normal and breath sounds normal.  Abdominal: Soft. Bowel sounds are normal. She exhibits no distension. There is no tenderness.  obese  Genitourinary:  No external lesionno flank tenderness. Mild suprapubic tenderness, left side  Musculoskeletal: Normal range of motion. She exhibits no edema or tenderness.  Neurological: She is alert. Coordination normal.  Skin: Skin is warm and dry. No rash noted.  Psychiatric: She has a normal mood and affect.  Nursing note and vitals reviewed.   ED Course  Procedures (including critical care time) Labs Review Labs Reviewed  URINALYSIS, ROUTINE W REFLEX MICROSCOPIC - Abnormal; Notable for the following:    APPearance CLOUDY (*)    Hgb urine dipstick TRACE (*)    All other components within normal  limits  URINE MICROSCOPIC-ADD ON - Abnormal; Notable for the following:    Squamous Epithelial / LPF MANY (*)    Bacteria, UA MANY (*)    Casts HYALINE CASTS (*)    All other components within normal limits    Imaging Review US Transvaginal Non-ob  05/16/2014   CLINICAL DATA:  Pt presents with constant sharp LLQ pain x 1 week. History of ovarian cysts. Uterus removed 4 years ago due to fibroids. No current hormones. No n/v. Also patient states urinary pain. 2 c-sections  EXAM: TRANSABDOMINAL AND TRANSVAGINAL ULTRASOUND OF PELVIS  TECHNIQUE: Both transabdominal and transvaginal ultrasound examinations of the pelvis were performed. Transabdominal technique was performed for global imaging of the pelvis including uterus, ovaries, adnexal regions, and pelvic cul-de-sac. It was necessary to proceed with endovaginal exam following the transabdominal exam to visualize the ovaries to better advantage.  COMPARISON:  CT abdomen and pelvis, 09/21/2012.  FINDINGS: Uterus  Surgically removed.  Right ovary  Measurements: 3.2 cm x 2.0 cm x 1.9 cm. Small peripheral ovarian calcifications. No right adnexal mass.  Left ovary  Measurements: 5 cm x 2.8 cm x 3.2 cm. Complex cysts containing internal echoes. Cyst measures 3.4 cm x 2.4 cm x 3 cm. Small simple appearing cysts lie adjacent to this. Ovarian tissue draped over this cyst shows normal color Doppler blood flow. Several small echogenic peripheral foci consistent with calcifications as seen on the right. No left adnexal mass.  Other findings  No free fluid.  IMPRESSION: 1. Hemorrhagic left ovarian cyst measuring 3.5 cm. 2. Status post hysterectomy. 3. No pelvic/adnexal masses.  No free fluid.   Electronically Signed   By: Amie Portland M.D.   On: 05/16/2014 16:44   US Pelvis Complete  05/16/2014   CLINICAL DATA:  Pt presents with constant sharp LLQ pain x 1 week. History of ovarian cysts. Uterus removed 4 years ago due to fibroids. No current hormones. No n/v. Also  patient states urinary pain. 2 c-sections  EXAM: TRANSABDOMINAL AND TRANSVAGINAL ULTRASOUND OF PELVIS  TECHNIQUE: Both transabdominal and transvaginal ultrasound examinations of the pelvis were performed. Transabdominal technique was performed for global imaging of the pelvis including uterus, ovaries, adnexal regions, and pelvic cul-de-sac. It was necessary to proceed with endovaginal exam following the transabdominal exam to visualize the ovaries to better advantage.  COMPARISON:  CT abdomen and pelvis, 09/21/2012.  FINDINGS: Uterus  Surgically removed.  Right ovary  Measurements: 3.2 cm x 2.0 cm x 1.9 cm. Small peripheral ovarian calcifications. No right adnexal mass.  Left ovary  Measurements: 5 cm x 2.8 cm x 3.2 cm. Complex cysts containing internal echoes. Cyst measures 3.4 cm x 2.4 cm x 3 cm. Small simple appearing cysts lie adjacent to this. Ovarian tissue draped over this cyst shows normal color Doppler blood flow. Several small echogenic peripheral foci consistent with calcifications as seen on the right. No left adnexal mass.  Other findings  No free fluid.  IMPRESSION: 1. Hemorrhagic left ovarian cyst measuring 3.5 cm. 2. Status post hysterectomy. 3. No pelvic/adnexal masses.  No free fluid.   Electronically Signed   By: Amie Portland M.D.   On: 05/16/2014 16:44     EKG Interpretation None      Results for orders placed or performed during the hospital encounter of 05/18/14  Urinalysis, Routine w reflex microscopic  Result Value Ref Range   Color, Urine YELLOW YELLOW   APPearance CLOUDY (A) CLEAR   Specific Gravity, Urine 1.021 1.005 - 1.030   pH 5.5 5.0 - 8.0   Glucose, UA NEGATIVE NEGATIVE mg/dL   Hgb urine dipstick TRACE (A) NEGATIVE   Bilirubin Urine NEGATIVE NEGATIVE   Ketones, ur NEGATIVE NEGATIVE mg/dL   Protein, ur NEGATIVE NEGATIVE mg/dL   Urobilinogen, UA 0.2 0.0 - 1.0 mg/dL   Nitrite NEGATIVE NEGATIVE   Leukocytes, UA NEGATIVE NEGATIVE  Urine microscopic-add on  Result  Value Ref Range   Squamous Epithelial / LPF MANY (A) RARE   WBC, UA 0-2 <3 WBC/hpf   RBC / HPF 0-2 <3 RBC/hpf   Bacteria, UA MANY (A) RARE   Casts HYALINE CASTS (A) NEGATIVE   US Transvaginal Non-ob  05/16/2014   CLINICAL DATA:  Pt presents with constant sharp LLQ pain x 1 week. History of ovarian cysts. Uterus removed 4 years ago due to fibroids. No current hormones. No n/v. Also patient states urinary pain. 2 c-sections  EXAM: TRANSABDOMINAL AND TRANSVAGINAL  ULTRASOUND OF PELVIS  TECHNIQUE: Both transabdominal and transvaginal ultrasound examinations of the pelvis were performed. Transabdominal technique was performed for global imaging of the pelvis including uterus, ovaries, adnexal regions, and pelvic cul-de-sac. It was necessary to proceed with endovaginal exam following the transabdominal exam to visualize the ovaries to better advantage.  COMPARISON:  CT abdomen and pelvis, 09/21/2012.  FINDINGS: Uterus  Surgically removed.  Right ovary  Measurements: 3.2 cm x 2.0 cm x 1.9 cm. Small peripheral ovarian calcifications. No right adnexal mass.  Left ovary  Measurements: 5 cm x 2.8 cm x 3.2 cm. Complex cysts containing internal echoes. Cyst measures 3.4 cm x 2.4 cm x 3 cm. Small simple appearing cysts lie adjacent to this. Ovarian tissue draped over this cyst shows normal color Doppler blood flow. Several small echogenic peripheral foci consistent with calcifications as seen on the right. No left adnexal mass.  Other findings  No free fluid.  IMPRESSION: 1. Hemorrhagic left ovarian cyst measuring 3.5 cm. 2. Status post hysterectomy. 3. No pelvic/adnexal masses.  No free fluid.   Electronically Signed   By: Amie Portlandavid  Ormond M.D.   On: 05/16/2014 16:44   [place] Pelvis Complete  05/16/2014   CLINICAL DATA:  Pt presents with constant sharp LLQ pain x 1 week. History of ovarian cysts. Uterus removed 4 years ago due to fibroids. No current hormones. No n/v. Also patient states urinary pain. 2 c-sections  EXAM:  TRANSABDOMINAL AND TRANSVAGINAL ULTRASOUND OF PELVIS  TECHNIQUE: Both transabdominal and transvaginal ultrasound examinations of the pelvis were performed. Transabdominal technique was performed for global imaging of the pelvis including uterus, ovaries, adnexal regions, and pelvic cul-de-sac. It was necessary to proceed with endovaginal exam following the transabdominal exam to visualize the ovaries to better advantage.  COMPARISON:  CT abdomen and pelvis, 09/21/2012.  FINDINGS: Uterus  Surgically removed.  Right ovary  Measurements: 3.2 cm x 2.0 cm x 1.9 cm. Small peripheral ovarian calcifications. No right adnexal mass.  Left ovary  Measurements: 5 cm x 2.8 cm x 3.2 cm. Complex cysts containing internal echoes. Cyst measures 3.4 cm x 2.4 cm x 3 cm. Small simple appearing cysts lie adjacent to this. Ovarian tissue draped over this cyst shows normal color Doppler blood flow. Several small echogenic peripheral foci consistent with calcifications as seen on the right. No left adnexal mass.  Other findings  No free fluid.  IMPRESSION: 1. Hemorrhagic left ovarian cyst measuring 3.5 cm. 2. Status post hysterectomy. 3. No pelvic/adnexal masses.  No free fluid.   Electronically Signed   By: Amie Portlandavid  Ormond M.D.   On: 05/16/2014 16:44     MDM  GC chlamydia cultures pending. Plan can continue to take Percocet. Pain felt secondary to ovarian cyst. [facility]. Blood pressure recheck Diagnosis #1 ovarian cyst #2 hypertension Final diagnoses:  None        Doug SouSam Emmajo Bennette, MD 05/18/14 1212  Doug SouSam Lenoir Facchini, MD 05/18/14 [id number]

## 2014-05-18 NOTE — Discharge Instructions (Signed)
Ovarian Cyst You may take up to 2 of the Percocet tablets prescribed earlier every 6 hours as needed for bad pain. Take Tylenol for mild pain. [facility] to schedule an appointment for within the next 2-3 weeks. Get your blood pressure rechecked within the next 3 weeks. Today's was mildly elevated at 153/93 An ovarian cyst is a fluid-filled sac that forms on an ovary. The ovaries are small organs that produce eggs in women. Various types of cysts can form on the ovaries. Most are not cancerous. Many do not cause problems, and they often go away on their own. Some may cause symptoms and require treatment. Common types of ovarian cysts include:  Functional cysts--These cysts may occur every month during the menstrual cycle. This is normal. The cysts usually go away with the next menstrual cycle if the woman does not get pregnant. Usually, there are no symptoms with a functional cyst.  Endometrioma cysts--These cysts form from the tissue that lines the uterus. They are also called "chocolate cysts" because they become filled with blood that turns brown. This type of cyst can cause pain in the lower abdomen during intercourse and with your menstrual period.  Cystadenoma cysts--This type develops from the cells on the outside of the ovary. These cysts can get very big and cause lower abdomen pain and pain with intercourse. This type of cyst can twist on itself, cut off its blood supply, and cause severe pain. It can also easily rupture and cause a lot of pain.  Dermoid cysts--This type of cyst is sometimes found in both ovaries. These cysts may contain different kinds of body tissue, such as skin, teeth, hair, or cartilage. They usually do not cause symptoms unless they get very big.  Theca lutein cysts--These cysts occur when too much of a certain hormone (human chorionic gonadotropin) is produced and overstimulates the ovaries to produce an egg. This is most common after procedures  used to assist with the conception of a baby (in vitro fertilization). CAUSES   Fertility drugs can cause a condition in which multiple large cysts are formed on the ovaries. This is called ovarian hyperstimulation syndrome.  A condition called polycystic ovary syndrome can cause hormonal imbalances that can lead to nonfunctional ovarian cysts. SIGNS AND SYMPTOMS  Many ovarian cysts do not cause symptoms. If symptoms are present, they may include:  Pelvic pain or pressure.  Pain in the lower abdomen.  Pain during sexual intercourse.  Increasing girth (swelling) of the abdomen.  Abnormal menstrual periods.  Increasing pain with menstrual periods.  Stopping having menstrual periods without being pregnant. DIAGNOSIS  These cysts are commonly found during a routine or annual pelvic exam. Tests may be ordered to find out more about the cyst. These tests may include:  Ultrasound.  X-ray of the pelvis.  CT scan.  MRI.  Blood tests. TREATMENT  Many ovarian cysts go away on their own without treatment. Your health care provider may want to check your cyst regularly for 2-3 months to see if it changes. For women in menopause, it is particularly important to monitor a cyst closely because of the higher rate of ovarian cancer in menopausal women. When treatment is needed, it may include any of the following:  A procedure to drain the cyst (aspiration). This may be done using a long needle and ultrasound. It can also be done through a laparoscopic procedure. This involves using a thin, lighted tube with a tiny camera on the end (laparoscope)  inserted through a small incision.  Surgery to remove the whole cyst. This may be done using laparoscopic surgery or an open surgery involving a larger incision in the lower abdomen.  Hormone treatment or birth control pills. These methods are sometimes used to help dissolve a cyst. HOME CARE INSTRUCTIONS   Only take over-the-counter or  prescription medicines as directed by your health care provider.  Follow up with your health care provider as directed.  Get regular pelvic exams and Pap tests. SEEK MEDICAL CARE IF:   Your periods are late, irregular, or painful, or they stop.  Your pelvic pain or abdominal pain does not go away.  Your abdomen becomes larger or swollen.  You have pressure on your bladder or trouble emptying your bladder completely.  You have pain during sexual intercourse.  You have feelings of fullness, pressure, or discomfort in your stomach.  You lose weight for no apparent reason.  You feel generally ill.  You become constipated.  You lose your appetite.  You develop acne.  You have an increase in body and facial hair.  You are gaining weight, without changing your exercise and eating habits.  You think you are pregnant. SEEK IMMEDIATE MEDICAL CARE IF:   You have increasing abdominal pain.  You feel sick to your stomach (nauseous), and you throw up (vomit).  You develop a fever that comes on suddenly.  You have abdominal pain during a bowel movement.  Your menstrual periods become heavier than usual. MAKE SURE YOU:  Understand these instructions.  Will watch your condition.  Will get help right away if you are not doing well or get worse. Document Released: 06/26/2005 Document Revised: 07/01/2013 Document Reviewed: 03/03/2013 [facility] Patient Information 2015 [employer] [place]. This information is not intended to replace advice given to you by your health care provider. Make sure you discuss any questions you have with your health care provider.

## 2014-05-18 NOTE — ED Notes (Signed)
Back pain. She was here 2 days ago for same. Was unable to see the referral MD we gave her. Pain is worse.

## 2014-05-18 NOTE — ED Notes (Signed)
MD at bedside. Nurse chaperoned MD for external perineal exam.

## 2014-05-19 LAB — GC/CHLAMYDIA PROBE AMP
CT PROBE, AMP APTIMA: NEGATIVE
GC Probe RNA: POSITIVE — AB

## 2014-05-20 ENCOUNTER — Telehealth (HOSPITAL_BASED_OUTPATIENT_CLINIC_OR_DEPARTMENT_OTHER): Payer: Self-pay | Admitting: Emergency Medicine

## 2014-05-20 NOTE — Telephone Encounter (Signed)
+   gonorrhea, chart handoff to EDP

## 2014-05-24 ENCOUNTER — Telehealth (HOSPITAL_COMMUNITY): Payer: Self-pay

## 2014-05-24 NOTE — Telephone Encounter (Signed)
Chart reviewed by Marlon Peliffany Greene PA  "Cefixime 400 mg po in a single dose, disp # 1 tab & Azithromycin 1 gram tab po in a single dose, disp #1 tab"   Pt informed of Dx, need for addl tx, notify partner(s) for testing and tx and abstain from sex x 2 wks post tx.  Rx x 2 called to [facility] [phone] and left on VM.  [facility] form completed and faxed.

## 2014-05-30 ENCOUNTER — Emergency Department (HOSPITAL_BASED_OUTPATIENT_CLINIC_OR_DEPARTMENT_OTHER)
Admission: EM | Admit: 2014-05-30 | Discharge: 2014-05-30 | Disposition: A | Discharge status: Home / Self Care | Payer: Medicaid Other | Attending: Emergency Medicine | Admitting: Emergency Medicine

## 2014-05-30 ENCOUNTER — Emergency Department (HOSPITAL_BASED_OUTPATIENT_CLINIC_OR_DEPARTMENT_OTHER): Payer: Medicaid Other

## 2014-05-30 ENCOUNTER — Encounter (HOSPITAL_BASED_OUTPATIENT_CLINIC_OR_DEPARTMENT_OTHER): Payer: Self-pay | Admitting: *Deleted

## 2014-05-30 DIAGNOSIS — E119 Type 2 diabetes mellitus without complications: Secondary | ICD-10-CM | POA: Diagnosis not present

## 2014-05-30 DIAGNOSIS — R109 Unspecified abdominal pain: Secondary | ICD-10-CM

## 2014-05-30 DIAGNOSIS — Z9089 Acquired absence of other organs: Secondary | ICD-10-CM | POA: Insufficient documentation

## 2014-05-30 DIAGNOSIS — Z720 Tobacco use: Secondary | ICD-10-CM | POA: Diagnosis not present

## 2014-05-30 DIAGNOSIS — I10 Essential (primary) hypertension: Secondary | ICD-10-CM | POA: Diagnosis not present

## 2014-05-30 DIAGNOSIS — Z8742 Personal history of other diseases of the female genital tract: Secondary | ICD-10-CM | POA: Diagnosis not present

## 2014-05-30 DIAGNOSIS — Z791 Long term (current) use of non-steroidal anti-inflammatories (NSAID): Secondary | ICD-10-CM | POA: Diagnosis not present

## 2014-05-30 DIAGNOSIS — Z79899 Other long term (current) drug therapy: Secondary | ICD-10-CM | POA: Insufficient documentation

## 2014-05-30 DIAGNOSIS — Z90710 Acquired absence of both cervix and uterus: Secondary | ICD-10-CM | POA: Insufficient documentation

## 2014-05-30 DIAGNOSIS — K219 Gastro-esophageal reflux disease without esophagitis: Secondary | ICD-10-CM | POA: Diagnosis not present

## 2014-05-30 DIAGNOSIS — R1032 Left lower quadrant pain: Secondary | ICD-10-CM | POA: Insufficient documentation

## 2014-05-30 DIAGNOSIS — Z792 Long term (current) use of antibiotics: Secondary | ICD-10-CM | POA: Diagnosis not present

## 2014-05-30 HISTORY — DX: Unspecified ovarian cyst, unspecified side: N83.209

## 2014-05-30 LAB — URINALYSIS, ROUTINE W REFLEX MICROSCOPIC
Bilirubin Urine: NEGATIVE
GLUCOSE, UA: NEGATIVE mg/dL
Hgb urine dipstick: NEGATIVE
KETONES UR: NEGATIVE mg/dL
Leukocytes, UA: NEGATIVE
Nitrite: NEGATIVE
Protein, ur: NEGATIVE mg/dL
Specific Gravity, Urine: 1.019 (ref 1.005–1.030)
Urobilinogen, UA: 1 mg/dL (ref 0.0–1.0)
pH: 6 (ref 5.0–8.0)

## 2014-05-30 LAB — CBC WITH DIFFERENTIAL/PLATELET
BASOS ABS: 0 10*3/uL (ref 0.0–0.1)
BASOS PCT: 0 % (ref 0–1)
EOS ABS: 0.1 10*3/uL (ref 0.0–0.7)
Eosinophils Relative: 2 % (ref 0–5)
HEMATOCRIT: 41.3 % (ref 36.0–46.0)
Hemoglobin: 13.5 g/dL (ref 12.0–15.0)
Lymphocytes Relative: 41 % (ref 12–46)
Lymphs Abs: 3.2 10*3/uL (ref 0.7–4.0)
MCH: 29 pg (ref 26.0–34.0)
MCHC: 32.7 g/dL (ref 30.0–36.0)
MCV: 88.8 fL (ref 78.0–100.0)
MONO ABS: 0.5 10*3/uL (ref 0.1–1.0)
Monocytes Relative: 6 % (ref 3–12)
Neutro Abs: 4 10*3/uL (ref 1.7–7.7)
Neutrophils Relative %: 51 % (ref 43–77)
Platelets: 198 10*3/uL (ref 150–400)
RBC: 4.65 MIL/uL (ref 3.87–5.11)
RDW: 14.2 % (ref 11.5–15.5)
WBC: 7.8 10*3/uL (ref 4.0–10.5)

## 2014-05-30 LAB — BASIC METABOLIC PANEL
ANION GAP: 12 (ref 5–15)
BUN: 7 mg/dL (ref 6–23)
CALCIUM: 9.2 mg/dL (ref 8.4–10.5)
CO2: 25 mEq/L (ref 19–32)
CREATININE: 0.9 mg/dL (ref 0.50–1.10)
Chloride: 106 mEq/L (ref 96–112)
GFR calc Af Amer: >90 mL/min (ref >90)
GFR, EST NON AFRICAN AMERICAN: 80 mL/min — AB (ref >90)
Glucose, Bld: 103 mg/dL — ABNORMAL HIGH (ref 70–99)
Potassium: 3.9 mEq/L (ref 3.7–5.3)
Sodium: 143 mEq/L (ref 137–147)

## 2014-05-30 MED ORDER — MORPHINE SULFATE 4 MG/ML IJ SOLN
4.0000 mg | Freq: Once | INTRAMUSCULAR | Status: AC
  Administered 2014-05-30: 4 mg via INTRAVENOUS
  Filled 2014-05-30: qty 1

## 2014-05-30 MED ORDER — ONDANSETRON HCL 4 MG/2ML IJ SOLN
4.0000 mg | Freq: Once | INTRAMUSCULAR | Status: AC
  Administered 2014-05-30: 4 mg via INTRAVENOUS
  Filled 2014-05-30: qty 2

## 2014-05-30 MED ORDER — TRAMADOL HCL 50 MG PO TABS: 50.0000 mg | ORAL_TABLET | Freq: Four times a day (QID) | ORAL | Status: DC | PRN

## 2014-05-30 MED ORDER — IOHEXOL 300 MG/ML  SOLN
125.0000 mL | Freq: Once | INTRAMUSCULAR | Status: AC | PRN
  Administered 2014-05-30: 125 mL via INTRAVENOUS

## 2014-05-30 MED ORDER — SODIUM CHLORIDE 0.9 % IV SOLN
INTRAVENOUS | Status: DC
  Administered 2014-05-30: 16:00:00 via INTRAVENOUS

## 2014-05-30 MED ORDER — IOHEXOL 300 MG/ML  SOLN
50.0000 mL | Freq: Once | INTRAMUSCULAR | Status: AC | PRN
  Administered 2014-05-30: 50 mL via ORAL

## 2014-05-30 NOTE — ED Provider Notes (Signed)
CSN: [id number]     Arrival date & time 05/30/14  1542 History  This chart was scribed for Toy BakerAnthony T Aj Crunkleton, MD by [name], ED Scribe. This patient was seen in room MH09/MH09 and the patient's care was started at 4:01 PM.   Chief Complaint  Patient presents with  . Abdominal Pain   The history is provided by the patient. No language interpreter was used.   HPI Comments: Landry DykeChrista L Etheleen MayhewLeach is a 38 y.o. female with a hx of ovarian cyst who presents to the Emergency Department complaining of intermittent moderate LLQ abdominal pain that started about a month ago. She states that the epsidoes of pain would last all day. She describes the pain as a dull sensation. She reports that she has been taking percocet with relief. She denies any urinary symptoms, along with emesis, chills, fever, or black stools.   Past Medical History  Diagnosis Date  . Hypertension   . GERD (gastroesophageal reflux disease)   . Diabetes mellitus without complication   . Ovarian cyst    Past Surgical History  Procedure Laterality Date  . Abdominal hysterectomy    . Cholecystectomy     Family History  Problem Relation Age of Onset  . Lupus Mother   . Seizures Father    History  Substance Use Topics  . Smoking status: Current Every Day Smoker -- 0.50 packs/day for 20 years    Types: Cigarettes  . Smokeless tobacco: Never Used  . Alcohol Use: Yes     Comment: occasional   OB History    No data available     Review of Systems  Constitutional: Negative for fever and chills.  Gastrointestinal: Positive for abdominal pain. Negative for blood in stool.  Genitourinary: Negative for dysuria.  All other systems reviewed and are negative.   Allergies  Review of patient's allergies indicates no known allergies.  Home Medications   Prior to Admission medications   Medication Sig Start Date End Date Taking? Authorizing Provider  gabapentin (NEURONTIN) 600 MG tablet Take 600 mg by mouth 2 (two) times  daily.   Yes Historical Provider, MD  LISINOPRIL PO Take by mouth.   Yes Historical Provider, MD  metFORMIN (GLUCOPHAGE) 500 MG tablet Take by mouth daily with breakfast.   Yes Historical Provider, MD  Omeprazole (PRILOSEC PO) Take 1 capsule by mouth daily.   Yes Historical Provider, MD  amLODipine (NORVASC) 10 MG tablet Take 10 mg by mouth daily.    Historical Provider, MD  amoxicillin-clavulanate (AUGMENTIN) 500-125 MG per tablet Take 1 tablet (500 mg total) by mouth 3 (three) times daily. 12/23/13   Myeong Sheard, DPM  atenolol-chlorthalidone (TENORETIC) 100-25 MG per tablet Take by mouth.    Historical Provider, MD  buPROPion (WELLBUTRIN XL) 300 MG 24 hr tablet Take 300 mg by mouth daily.    Historical Provider, MD  carisoprodol (SOMA) 350 MG tablet Take 350 mg by mouth 4 (four) times daily as needed.    Historical Provider, MD  cyclobenzaprine (FLEXERIL) 10 MG tablet Take 1 tablet (10 mg total) by mouth 2 (two) times daily as needed for muscle spasms. 10/31/13   Tammy L. Triplett, PA-C  HYDROcodone-acetaminophen (NORCO/VICODIN) 5-325 MG per tablet Take 1 tablet by mouth every 4 (four) hours as needed. 12/19/13   Myeong Sheard, DPM  Ibuprofen 200 MG CAPS Take 3 capsules (600 mg total) by mouth every 6 (six) hours as needed. 05/16/14   Rich Numberarly Rivet, MD  losartan-hydrochlorothiazide (HYZAAR) 100-12.5 MG per  tablet Take 1 tablet by mouth daily.    Historical Provider, MD  metoprolol succinate (TOPROL-XL) 25 MG 24 hr tablet Take 25 mg by mouth daily.    Historical Provider, MD  metroNIDAZOLE (FLAGYL) 500 MG tablet Take 1 tablet (500 mg total) by mouth 2 (two) times daily. Do not drink alcohol with this medication. 07/11/13   [name], DO  Multiple Vitamin (MULITIVITAMIN WITH MINERALS) TABS Take 1 tablet by mouth daily.    Historical Provider, MD  oxyCODONE-acetaminophen (PERCOCET) 10-325 MG per tablet Take 1 tablet by mouth every 6 (six) hours as needed for pain. 05/16/14   Rich Numberarly Rivet, MD   pantoprazole (PROTONIX) 40 MG tablet Take 40 mg by mouth.    Historical Provider, MD  phenazopyridine (PYRIDIUM) 95 MG tablet Take 1 tablet (95 mg total) by mouth 3 (three) times daily as needed for pain. 07/11/13   [name], DO  phentermine 37.5 MG capsule Take 37.5 mg by mouth every morning.    Historical Provider, MD  pravastatin (PRAVACHOL) 20 MG tablet Take 20 mg by mouth daily.    Historical Provider, MD  PRESCRIPTION MEDICATION Take 1 tablet by mouth daily. Blood pressure medication    Historical Provider, MD  promethazine (PHENERGAN) 25 MG tablet Take 1 tablet (25 mg total) by mouth every 6 (six) hours as needed for nausea or vomiting. 03/24/14   Ethelda ChickMartha K Linker, MD  traMADol (ULTRAM) 50 MG tablet Take 2 tablets (100 mg total) by mouth 3 (three) times daily. 01/21/13   Erick ColaceAndrew E Kirsteins, MD   BP 167/97 mmHg  Pulse 65  Temp(Src) 98.2 F (36.8 C) (Oral)  Resp 20  Ht 5\' 9"  (1.753 m)  Wt 260 lb (117.935 kg)  BMI 38.38 kg/m2  SpO2 98% Physical Exam  Constitutional: She is oriented to person, place, and time. She appears well-developed and well-nourished.  Non-toxic appearance. No distress.  HENT:  Head: Normocephalic and atraumatic.  Eyes: Conjunctivae, EOM and lids are normal. Pupils are equal, round, and reactive to light.  Neck: Normal range of motion. Neck supple. No tracheal deviation present. No thyroid mass present.  Cardiovascular: Normal rate, regular rhythm and normal heart sounds.  Exam reveals no gallop.   No murmur heard. Pulmonary/Chest: Effort normal and breath sounds normal. No stridor. No respiratory distress. She has no decreased breath sounds. She has no wheezes. She has no rhonchi. She has no rales.  Abdominal: Soft. Normal appearance and bowel sounds are normal. She exhibits no distension. There is tenderness. There is no rebound, no guarding and no CVA tenderness.  LLQ TTP without rebound or guarding.   Musculoskeletal: Normal range of motion. She exhibits no  edema or tenderness.  Neurological: She is alert and oriented to person, place, and time. She has normal strength. No cranial nerve deficit or sensory deficit. GCS eye subscore is 4. GCS verbal subscore is 5. GCS motor subscore is 6.  Skin: Skin is warm and dry. No abrasion and no rash noted.  Psychiatric: She has a normal mood and affect. Her speech is normal and behavior is normal.  Nursing note and vitals reviewed.   ED Course  Procedures (including critical care time) DIAGNOSTIC STUDIES: Oxygen Saturation is 98% on RA, normal by my interpretation.    COORDINATION OF CARE: 4:05 PM- Pt advised of plan for treatment which includes medication, radiology, and labs and pt agrees.  Labs Review Labs Reviewed  BASIC METABOLIC PANEL - Abnormal; Notable for the following:  Glucose, Bld 103 (*)    GFR calc non Af Amer 80 (*)    All other components within normal limits  URINALYSIS, ROUTINE W REFLEX MICROSCOPIC  CBC WITH DIFFERENTIAL    Imaging Review No results found.   EKG Interpretation None      MDM   Final diagnoses:  Abdominal pain    I personally performed the services described in this documentation, which was scribed in my presence. The recorded information has been reviewed and is accurate.  Patient given pain medication here feels better. No evidence of surgical process noted on her abdominal CT. Has been given referral to gynecology by her primary care physician. Stable for discharge    Toy Baker, MD 05/30/14 1814

## 2014-05-30 NOTE — ED Notes (Signed)
C/o LLQ abd pain- Hx of ovarian cysts- last BM today-

## 2014-05-30 NOTE — Discharge Instructions (Signed)
Take your Ultram as directed Abdominal Pain Many things can cause abdominal pain. Usually, abdominal pain is not caused by a disease and will improve without treatment. It can often be observed and treated at home. Your health care provider will do a physical exam and possibly order blood tests and X-rays to help determine the seriousness of your pain. However, in many cases, more time must pass before a clear cause of the pain can be found. Before that point, your health care provider may not know if you need more testing or further treatment. HOME CARE INSTRUCTIONS  Monitor your abdominal pain for any changes. The following actions may help to alleviate any discomfort you are experiencing:  Only take over-the-counter or prescription medicines as directed by your health care provider.  Do not take laxatives unless directed to do so by your health care provider.  Try a clear liquid diet (broth, tea, or water) as directed by your health care provider. Slowly move to a bland diet as tolerated. SEEK MEDICAL CARE IF:  You have unexplained abdominal pain.  You have abdominal pain associated with nausea or diarrhea.  You have pain when you urinate or have a bowel movement.  You experience abdominal pain that wakes you in the night.  You have abdominal pain that is worsened or improved by eating food.  You have abdominal pain that is worsened with eating fatty foods.  You have a fever. SEEK IMMEDIATE MEDICAL CARE IF:   Your pain does not go away within 2 hours.  You keep throwing up (vomiting).  Your pain is felt only in portions of the abdomen, such as the right side or the left lower portion of the abdomen.  You pass bloody or black tarry stools. MAKE SURE YOU:  Understand these instructions.   Will watch your condition.   Will get help right away if you are not doing well or get worse.  Document Released: 04/05/2005 Document Revised: 07/01/2013 Document Reviewed:  03/05/2013 [facility] Patient Information 2015 [place], [place]. This information is not intended to replace advice given to you by your health care provider. Make sure you discuss any questions you have with your health care provider.  Abdominal Pain, Women Abdominal (stomach, pelvic, or belly) pain can be caused by many things. It is important to tell your doctor:  The location of the pain.  Does it come and go or is it present all the time?  Are there things that start the pain (eating certain foods, exercise)?  Are there other symptoms associated with the pain (fever, nausea, vomiting, diarrhea)? All of this is helpful to know when trying to find the cause of the pain. CAUSES   Stomach: virus or bacteria infection, or ulcer.  Intestine: appendicitis (inflamed appendix), regional ileitis (Crohn's disease), ulcerative colitis (inflamed colon), irritable bowel syndrome, diverticulitis (inflamed diverticulum of the colon), or cancer of the stomach or intestine.  Gallbladder disease or stones in the gallbladder.  Kidney disease, kidney stones, or infection.  Pancreas infection or cancer.  Fibromyalgia (pain disorder).  Diseases of the female organs:  Uterus: fibroid (non-cancerous) tumors or infection.  Fallopian tubes: infection or tubal pregnancy.  Ovary: cysts or tumors.  Pelvic adhesions (scar tissue).  Endometriosis (uterus lining tissue growing in the pelvis and on the pelvic organs).  Pelvic congestion syndrome (female organs filling up with blood just before the menstrual period).  Pain with the menstrual period.  Pain with ovulation (producing an egg).  Pain with an IUD (intrauterine device,  birth control) in the uterus.  Cancer of the female organs.  Functional pain (pain not caused by a disease, may improve without treatment).  Psychological pain.  Depression. DIAGNOSIS  Your doctor will decide the seriousness of your pain by doing an  examination.  Blood tests.  X-rays.  Ultrasound.  CT scan (computed tomography, special type of X-ray).  MRI (magnetic resonance imaging).  Cultures, for infection.  Barium enema (dye inserted in the large intestine, to better view it with X-rays).  Colonoscopy (looking in intestine with a lighted tube).  Laparoscopy (minor surgery, looking in abdomen with a lighted tube).  Major abdominal exploratory surgery (looking in abdomen with a large incision). TREATMENT  The treatment will depend on the cause of the pain.   Many cases can be observed and treated at home.  Over-the-counter medicines recommended by your caregiver.  Prescription medicine.  Antibiotics, for infection.  Birth control pills, for painful periods or for ovulation pain.  Hormone treatment, for endometriosis.  Nerve blocking injections.  Physical therapy.  Antidepressants.  Counseling with a psychologist or psychiatrist.  Minor or major surgery. HOME CARE INSTRUCTIONS   Do not take laxatives, unless directed by your caregiver.  Take over-the-counter pain medicine only if ordered by your caregiver. Do not take aspirin because it can cause an upset stomach or bleeding.  Try a clear liquid diet (broth or water) as ordered by your caregiver. Slowly move to a bland diet, as tolerated, if the pain is related to the stomach or intestine.  Have a thermometer and take your temperature several times a day, and record it.  Bed rest and sleep, if it helps the pain.  Avoid sexual intercourse, if it causes pain.  Avoid stressful situations.  Keep your follow-up appointments and tests, as your caregiver orders.  If the pain does not go away with medicine or surgery, you may try:  Acupuncture.  Relaxation exercises (yoga, meditation).  Group therapy.  Counseling. SEEK MEDICAL CARE IF:   You notice certain foods cause stomach pain.  Your home care treatment is not helping your pain.  You  need stronger pain medicine.  You want your IUD removed.  You feel faint or lightheaded.  You develop nausea and vomiting.  You develop a rash.  You are having side effects or an allergy to your medicine. SEEK IMMEDIATE MEDICAL CARE IF:   Your pain does not go away or gets worse.  You have a fever.  Your pain is felt only in portions of the abdomen. The right side could possibly be appendicitis. The left lower portion of the abdomen could be colitis or diverticulitis.  You are passing blood in your stools (bright red or black tarry stools, with or without vomiting).  You have blood in your urine.  You develop chills, with or without a fever.  You pass out. MAKE SURE YOU:   Understand these instructions.  Will watch your condition.  Will get help right away if you are not doing well or get worse. Document Released: 04/23/2007 Document Revised: 11/10/2013 Document Reviewed: 05/13/2009 [facility] Patient Information 2015 [place], [place]. This information is not intended to replace advice given to you by your health care provider. Make sure you discuss any questions you have with your health care provider.

## 2014-07-10 DIAGNOSIS — B1920 Unspecified viral hepatitis C without hepatic coma: Secondary | ICD-10-CM

## 2014-07-10 HISTORY — DX: Unspecified viral hepatitis C without hepatic coma: B19.20

## 2014-07-30 ENCOUNTER — Emergency Department (HOSPITAL_BASED_OUTPATIENT_CLINIC_OR_DEPARTMENT_OTHER): Payer: Medicaid Other

## 2014-07-30 ENCOUNTER — Emergency Department (HOSPITAL_BASED_OUTPATIENT_CLINIC_OR_DEPARTMENT_OTHER)
Admission: EM | Admit: 2014-07-30 | Discharge: 2014-07-30 | Disposition: A | Discharge status: Home / Self Care | Payer: Medicaid Other | Attending: Emergency Medicine | Admitting: Emergency Medicine

## 2014-07-30 ENCOUNTER — Encounter (HOSPITAL_BASED_OUTPATIENT_CLINIC_OR_DEPARTMENT_OTHER): Payer: Self-pay | Admitting: *Deleted

## 2014-07-30 DIAGNOSIS — R102 Pelvic and perineal pain: Secondary | ICD-10-CM

## 2014-07-30 DIAGNOSIS — K219 Gastro-esophageal reflux disease without esophagitis: Secondary | ICD-10-CM | POA: Insufficient documentation

## 2014-07-30 DIAGNOSIS — Z9049 Acquired absence of other specified parts of digestive tract: Secondary | ICD-10-CM | POA: Insufficient documentation

## 2014-07-30 DIAGNOSIS — E119 Type 2 diabetes mellitus without complications: Secondary | ICD-10-CM | POA: Insufficient documentation

## 2014-07-30 DIAGNOSIS — I10 Essential (primary) hypertension: Secondary | ICD-10-CM | POA: Insufficient documentation

## 2014-07-30 DIAGNOSIS — N83202 Unspecified ovarian cyst, left side: Secondary | ICD-10-CM

## 2014-07-30 DIAGNOSIS — Z720 Tobacco use: Secondary | ICD-10-CM | POA: Insufficient documentation

## 2014-07-30 DIAGNOSIS — N83201 Unspecified ovarian cyst, right side: Secondary | ICD-10-CM

## 2014-07-30 DIAGNOSIS — N8320 Unspecified ovarian cysts: Secondary | ICD-10-CM | POA: Insufficient documentation

## 2014-07-30 DIAGNOSIS — Z90710 Acquired absence of both cervix and uterus: Secondary | ICD-10-CM | POA: Insufficient documentation

## 2014-07-30 DIAGNOSIS — Z79899 Other long term (current) drug therapy: Secondary | ICD-10-CM | POA: Insufficient documentation

## 2014-07-30 LAB — URINE MICROSCOPIC-ADD ON

## 2014-07-30 LAB — CBG MONITORING, ED: GLUCOSE-CAPILLARY: 96 mg/dL (ref 70–99)

## 2014-07-30 LAB — URINALYSIS, ROUTINE W REFLEX MICROSCOPIC
Bilirubin Urine: NEGATIVE
GLUCOSE, UA: NEGATIVE mg/dL
KETONES UR: NEGATIVE mg/dL
Leukocytes, UA: NEGATIVE
Nitrite: NEGATIVE
PH: 5.5 (ref 5.0–8.0)
Protein, ur: 30 mg/dL — AB
Specific Gravity, Urine: 1.021 (ref 1.005–1.030)
UROBILINOGEN UA: 0.2 mg/dL (ref 0.0–1.0)

## 2014-07-30 LAB — WET PREP, GENITAL
Trich, Wet Prep: NONE SEEN
Yeast Wet Prep HPF POC: NONE SEEN

## 2014-07-30 MED ORDER — CEFTRIAXONE SODIUM 250 MG IJ SOLR
250.0000 mg | Freq: Once | INTRAMUSCULAR | Status: AC
  Administered 2014-07-30: 250 mg via INTRAMUSCULAR
  Filled 2014-07-30: qty 250

## 2014-07-30 MED ORDER — HYDROCODONE-ACETAMINOPHEN 5-325 MG PO TABS: 1.0000 | ORAL_TABLET | Freq: Four times a day (QID) | ORAL | Status: DC | PRN

## 2014-07-30 MED ORDER — AZITHROMYCIN 250 MG PO TABS
1000.0000 mg | ORAL_TABLET | Freq: Once | ORAL | Status: AC
  Administered 2014-07-30: 1000 mg via ORAL
  Filled 2014-07-30: qty 4

## 2014-07-30 MED ORDER — LIDOCAINE HCL (PF) 1 % IJ SOLN
INTRAMUSCULAR | Status: AC
  Administered 2014-07-30: 1.2 mL
  Filled 2014-07-30: qty 5

## 2014-07-30 MED ORDER — METRONIDAZOLE 500 MG PO TABS: 500.0000 mg | ORAL_TABLET | Freq: Two times a day (BID) | ORAL | Status: DC

## 2014-07-30 MED ORDER — HYDROCODONE-ACETAMINOPHEN 5-325 MG PO TABS: 1.0000 | ORAL_TABLET | ORAL | Status: DC | PRN

## 2014-07-30 MED ORDER — HYDROCODONE-ACETAMINOPHEN 5-325 MG PO TABS
2.0000 | ORAL_TABLET | Freq: Once | ORAL | Status: AC
  Administered 2014-07-30: 2 via ORAL
  Filled 2014-07-30: qty 2

## 2014-07-30 NOTE — ED Notes (Signed)
Reports 2 weeks of dysuria- now c/o lower abd pain and vaginal odor

## 2014-07-30 NOTE — Discharge Instructions (Signed)
Ovarian Cyst Take Tylenol for mild pain or the pain medicine prescribed for bad pain. Make sure that you finish the antibiotic prescribed. Use a condom each time that you have sex in order to prevent sexually transmitted diseases. Call Dr. Zenovia Jarredse -Bonsu's office tomorrow to arrange to get your blood pressure recheck in 1 week. Today's was elevated at 186/111. Ask them for referral to a gynecologist. You should get a repeat ultrasound of your pelvic organs in 3 months An ovarian cyst is a fluid-filled sac that forms on an ovary. The ovaries are small organs that produce eggs in women. Various types of cysts can form on the ovaries. Most are not cancerous. Many do not cause problems, and they often go away on their own. Some may cause symptoms and require treatment. Common types of ovarian cysts include:  Functional cysts--These cysts may occur every month during the menstrual cycle. This is normal. The cysts usually go away with the next menstrual cycle if the woman does not get pregnant. Usually, there are no symptoms with a functional cyst.  Endometrioma cysts--These cysts form from the tissue that lines the uterus. They are also called "chocolate cysts" because they become filled with blood that turns brown. This type of cyst can cause pain in the lower abdomen during intercourse and with your menstrual period.  Cystadenoma cysts--This type develops from the cells on the outside of the ovary. These cysts can get very big and cause lower abdomen pain and pain with intercourse. This type of cyst can twist on itself, cut off its blood supply, and cause severe pain. It can also easily rupture and cause a lot of pain.  Dermoid cysts--This type of cyst is sometimes found in both ovaries. These cysts may contain different kinds of body tissue, such as skin, teeth, hair, or cartilage. They usually do not cause symptoms unless they get very big.  Theca lutein cysts--These cysts occur when too much of a certain  hormone (human chorionic gonadotropin) is produced and overstimulates the ovaries to produce an egg. This is most common after procedures used to assist with the conception of a baby (in vitro fertilization). CAUSES   Fertility drugs can cause a condition in which multiple large cysts are formed on the ovaries. This is called ovarian hyperstimulation syndrome.  A condition called polycystic ovary syndrome can cause hormonal imbalances that can lead to nonfunctional ovarian cysts. SIGNS AND SYMPTOMS  Many ovarian cysts do not cause symptoms. If symptoms are present, they may include:  Pelvic pain or pressure.  Pain in the lower abdomen.  Pain during sexual intercourse.  Increasing girth (swelling) of the abdomen.  Abnormal menstrual periods.  Increasing pain with menstrual periods.  Stopping having menstrual periods without being pregnant. DIAGNOSIS  These cysts are commonly found during a routine or annual pelvic exam. Tests may be ordered to find out more about the cyst. These tests may include:  Ultrasound.  X-ray of the pelvis.  CT scan.  MRI.  Blood tests. TREATMENT  Many ovarian cysts go away on their own without treatment. Your health care provider may want to check your cyst regularly for 2-3 months to see if it changes. For women in menopause, it is particularly important to monitor a cyst closely because of the higher rate of ovarian cancer in menopausal women. When treatment is needed, it may include any of the following:  A procedure to drain the cyst (aspiration). This may be done using a long needle and ultrasound. It  can also be done through a laparoscopic procedure. This involves using a thin, lighted tube with a tiny camera on the end (laparoscope) inserted through a small incision.  Surgery to remove the whole cyst. This may be done using laparoscopic surgery or an open surgery involving a larger incision in the lower abdomen.  Hormone treatment or birth  control pills. These methods are sometimes used to help dissolve a cyst. HOME CARE INSTRUCTIONS   Only take over-the-counter or prescription medicines as directed by your health care provider.  Follow up with your health care provider as directed.  Get regular pelvic exams and Pap tests. SEEK MEDICAL CARE IF:   Your periods are late, irregular, or painful, or they stop.  Your pelvic pain or abdominal pain does not go away.  Your abdomen becomes larger or swollen.  You have pressure on your bladder or trouble emptying your bladder completely.  You have pain during sexual intercourse.  You have feelings of fullness, pressure, or discomfort in your stomach.  You lose weight for no apparent reason.  You feel generally ill.  You become constipated.  You lose your appetite.  You develop acne.  You have an increase in body and facial hair.  You are gaining weight, without changing your exercise and eating habits.  You think you are pregnant. SEEK IMMEDIATE MEDICAL CARE IF:   You have increasing abdominal pain.  You feel sick to your stomach (nauseous), and you throw up (vomit).  You develop a fever that comes on suddenly.  You have abdominal pain during a bowel movement.  Your menstrual periods become heavier than usual. MAKE SURE YOU:  Understand these instructions.  Will watch your condition.  Will get help right away if you are not doing well or get worse. Document Released: 06/26/2005 Document Revised: 07/01/2013 Document Reviewed: 03/03/2013 [facility] Patient Information 2015 [place], [place]. This information is not intended to replace advice given to you by your health care provider. Make sure you discuss any questions you have with your health care provider.

## 2014-07-30 NOTE — ED Provider Notes (Signed)
CSN: [id number]     Arrival date & time 07/30/14  1636 History   First MD Initiated Contact with Patient 07/30/14 1655     Chief Complaint  Patient presents with  . Dysuria     (Consider location/radiation/quality/duration/timing/severity/associated sxs/prior Treatment) HPI Complains of suprapubic discomfort with urination, vaginal odor and low back pain for 2 weeks. Pain radiates to left flank No fever. No anorexia. No nausea or vomiting. Has been treating herself with ibuprofen Tylenol, without relief. No other associated symptoms. Denies vaginal discharge. Pain is constant Pain feels similar to pain that she's had in the past when she's had ovarian cysts and gonorrhea. No other associated symptoms. Past Medical History  Diagnosis Date  . Hypertension   . GERD (gastroesophageal reflux disease)   . Diabetes mellitus without complication   . Ovarian cyst    Past Surgical History  Procedure Laterality Date  . Abdominal hysterectomy    . Cholecystectomy     partial hysterectomy. Has both ovaries Family History  Problem Relation Age of Onset  . Lupus Mother   . Seizures Father    History  Substance Use Topics  . Smoking status: Current Every Day Smoker -- 0.50 packs/day for 20 years    Types: Cigarettes  . Smokeless tobacco: Never Used  . Alcohol Use: Yes     Comment: occasional   no illicit drug use OB History    No data available     Review of Systems  Constitutional: Negative.   HENT: Negative.   Respiratory: Negative.   Cardiovascular: Negative.   Gastrointestinal: Negative.   Genitourinary: Positive for dysuria and flank pain.       Vaginal odor, left flank pain  Musculoskeletal: Negative.   Skin: Negative.   Neurological: Negative.   Psychiatric/Behavioral: Negative.   All other systems reviewed and are negative.     Allergies  Review of patient's allergies indicates no known allergies.  Home Medications   Prior to Admission medications   Medication  Sig Start Date End Date Taking? Authorizing Provider  atenolol-chlorthalidone (TENORETIC) 100-25 MG per tablet Take by mouth.   Yes Historical Provider, MD  gabapentin (NEURONTIN) 600 MG tablet Take 600 mg by mouth 2 (two) times daily.   Yes Historical Provider, MD  LISINOPRIL PO Take by mouth.   Yes Historical Provider, MD  metFORMIN (GLUCOPHAGE) 500 MG tablet Take by mouth daily with breakfast.   Yes Historical Provider, MD  pantoprazole (PROTONIX) 40 MG tablet Take 40 mg by mouth.   Yes Historical Provider, MD  amLODipine (NORVASC) 10 MG tablet Take 10 mg by mouth daily.    Historical Provider, MD  amoxicillin-clavulanate (AUGMENTIN) 500-125 MG per tablet Take 1 tablet (500 mg total) by mouth 3 (three) times daily. 12/23/13   Myeong Sheard, DPM  buPROPion (WELLBUTRIN XL) 300 MG 24 hr tablet Take 300 mg by mouth daily.    Historical Provider, MD  carisoprodol (SOMA) 350 MG tablet Take 350 mg by mouth 4 (four) times daily as needed.    Historical Provider, MD  cyclobenzaprine (FLEXERIL) 10 MG tablet Take 1 tablet (10 mg total) by mouth 2 (two) times daily as needed for muscle spasms. 10/31/13   Tammy L. Triplett, PA-C  HYDROcodone-acetaminophen (NORCO/VICODIN) 5-325 MG per tablet Take 1 tablet by mouth every 4 (four) hours as needed. 12/19/13   Myeong Sheard, DPM  Ibuprofen 200 MG CAPS Take 3 capsules (600 mg total) by mouth every 6 (six) hours as needed. 05/16/14   Carly Rivet,  MD  losartan-hydrochlorothiazide (HYZAAR) 100-12.5 MG per tablet Take 1 tablet by mouth daily.    Historical Provider, MD  metoprolol succinate (TOPROL-XL) 25 MG 24 hr tablet Take 25 mg by mouth daily.    Historical Provider, MD  metroNIDAZOLE (FLAGYL) 500 MG tablet Take 1 tablet (500 mg total) by mouth 2 (two) times daily. Do not drink alcohol with this medication. 07/11/13   [name], DO  Multiple Vitamin (MULITIVITAMIN WITH MINERALS) TABS Take 1 tablet by mouth daily.    Historical Provider, MD  Omeprazole (PRILOSEC PO)  Take 1 capsule by mouth daily.    Historical Provider, MD  oxyCODONE-acetaminophen (PERCOCET) 10-325 MG per tablet Take 1 tablet by mouth every 6 (six) hours as needed for pain. 05/16/14   Rich Number, MD  phenazopyridine (PYRIDIUM) 95 MG tablet Take 1 tablet (95 mg total) by mouth 3 (three) times daily as needed for pain. 07/11/13   [name], DO  phentermine 37.5 MG capsule Take 37.5 mg by mouth every morning.    Historical Provider, MD  pravastatin (PRAVACHOL) 20 MG tablet Take 20 mg by mouth daily.    Historical Provider, MD  PRESCRIPTION MEDICATION Take 1 tablet by mouth daily. Blood pressure medication    Historical Provider, MD  promethazine (PHENERGAN) 25 MG tablet Take 1 tablet (25 mg total) by mouth every 6 (six) hours as needed for nausea or vomiting. 03/24/14   Ethelda Chick, MD  traMADol (ULTRAM) 50 MG tablet Take 1 tablet (50 mg total) by mouth every 6 (six) hours as needed. 05/30/14   Toy Baker, MD   BP 169/98 mmHg  Pulse 86  Temp(Src) 99.1 F (37.3 C) (Oral)  Resp 18  Ht  (1.753 m)  Wt 260 lb (117.935 kg)  BMI 38.38 kg/m2  SpO2 98% Physical Exam  Constitutional: She appears well-developed and well-nourished.  HENT:  Head: Normocephalic and atraumatic.  Eyes: Conjunctivae are normal. Pupils are equal, round, and reactive to light.  Neck: Neck supple. No tracheal deviation present. No thyromegaly present.  Cardiovascular: Normal rate and regular rhythm.   No murmur heard. Pulmonary/Chest: Effort normal and breath sounds normal.  Abdominal: Soft. Bowel sounds are normal. She exhibits no distension. There is no tenderness.  Obese  Genitourinary:  Mildly tender left flank. White discharge in vault. Left adnexal tenderness no masses. No right adnexal tenderness  Musculoskeletal: Normal range of motion. She exhibits no edema or tenderness.  Neurological: She is alert. Coordination normal.  Skin: Skin is warm and dry. No rash noted.  Psychiatric: She has a  normal mood and affect.  Nursing note and vitals reviewed.  Results for orders placed or performed during the hospital encounter of 07/30/14  Wet prep, genital  Result Value Ref Range   Yeast Wet Prep HPF POC NONE SEEN NONE SEEN   Trich, Wet Prep NONE SEEN NONE SEEN   Clue Cells Wet Prep HPF POC MODERATE (A) NONE SEEN   WBC, Wet Prep HPF POC FEW (A) NONE SEEN  Urinalysis, Routine w reflex microscopic  Result Value Ref Range   Color, Urine YELLOW YELLOW   APPearance CLOUDY (A) CLEAR   Specific Gravity, Urine 1.021 1.005 - 1.030   pH 5.5 5.0 - 8.0   Glucose, UA NEGATIVE NEGATIVE mg/dL   Hgb urine dipstick TRACE (A) NEGATIVE   Bilirubin Urine NEGATIVE NEGATIVE   Ketones, ur NEGATIVE NEGATIVE mg/dL   Protein, ur 30 (A) NEGATIVE mg/dL   Urobilinogen, UA 0.2 0.0 -  1.0 mg/dL   Nitrite NEGATIVE NEGATIVE   Leukocytes, UA NEGATIVE NEGATIVE  Urine microscopic-add on  Result Value Ref Range   Squamous Epithelial / LPF FEW (A) RARE   WBC, UA 0-2 <3 WBC/hpf   RBC / HPF 3-6 <3 RBC/hpf   Bacteria, UA RARE RARE   Urine-Other MUCOUS PRESENT   POC CBG, ED  Result Value Ref Range   Glucose-Capillary 96 70 - 99 mg/dL   US Transvaginal Non-ob  07/30/2014   CLINICAL DATA:  Left lower quadrant pain for 1 month. History of ovarian cysts. Status post hysterectomy.  EXAM: TRANSABDOMINAL AND TRANSVAGINAL ULTRASOUND OF PELVIS  DOPPLER ULTRASOUND OF OVARIES  TECHNIQUE: Both transabdominal and transvaginal ultrasound examinations of the pelvis were performed. Transabdominal technique was performed for global imaging of the pelvis including uterus, ovaries, adnexal regions, and pelvic cul-de-sac.  It was necessary to proceed with endovaginal exam following the transabdominal exam to visualize the ovaries to better advantage. Color and duplex Doppler ultrasound was utilized to evaluate blood flow to the ovaries.  COMPARISON:  06/13/2014  FINDINGS: Uterus  Surgically absent.  No pelvic masses.  Right ovary   Measurements: 2.5 cm x 1.9 cm x 1.8 cm small mildly echogenic focus measuring 13 mm without internal blood flow lies within the right ovary, likely a involuting or hemorrhagic cyst. No adnexal mass.  Left ovary  Measurements: 4.6 cm x 3 cm x 3.1 cm. Complex cyst arises from the left ovary measuring 2.1 cm in size. Cyst has internal debris suggesting a hemorrhagic cyst. Remainder the ovaries unremarkable. No adnexal masses.  Pulsed Doppler evaluation of both ovaries demonstrates normal low-resistance arterial and venous waveforms.  Other findings  No free fluid.  IMPRESSION: 1. No evidence of ovarian torsion. 2. 2.1 cm complex left ovarian cyst likely a hemorrhagic cyst. 3. Probable complex 13 mm right ovarian cyst. 4. Status post hysterectomy.  No pelvic masses.  No free fluid.   Electronically Signed   By: Amie Portland M.D.   On: 07/30/2014 18:47   US Pelvis Complete  07/30/2014   CLINICAL DATA:  Left lower quadrant pain for 1 month. History of ovarian cysts. Status post hysterectomy.  EXAM: TRANSABDOMINAL AND TRANSVAGINAL ULTRASOUND OF PELVIS  DOPPLER ULTRASOUND OF OVARIES  TECHNIQUE: Both transabdominal and transvaginal ultrasound examinations of the pelvis were performed. Transabdominal technique was performed for global imaging of the pelvis including uterus, ovaries, adnexal regions, and pelvic cul-de-sac.  It was necessary to proceed with endovaginal exam following the transabdominal exam to visualize the ovaries to better advantage. Color and duplex Doppler ultrasound was utilized to evaluate blood flow to the ovaries.  COMPARISON:  06/13/2014  FINDINGS: Uterus  Surgically absent.  No pelvic masses.  Right ovary  Measurements: 2.5 cm x 1.9 cm x 1.8 cm small mildly echogenic focus measuring 13 mm without internal blood flow lies within the right ovary, likely a involuting or hemorrhagic cyst. No adnexal mass.  Left ovary  Measurements: 4.6 cm x 3 cm x 3.1 cm. Complex cyst arises from the left ovary  measuring 2.1 cm in size. Cyst has internal debris suggesting a hemorrhagic cyst. Remainder the ovaries unremarkable. No adnexal masses.  Pulsed Doppler evaluation of both ovaries demonstrates normal low-resistance arterial and venous waveforms.  Other findings  No free fluid.  IMPRESSION: 1. No evidence of ovarian torsion. 2. 2.1 cm complex left ovarian cyst likely a hemorrhagic cyst. 3. Probable complex 13 mm right ovarian cyst. 4. Status post hysterectomy.  No pelvic  masses.  No free fluid.   Electronically Signed   By: Amie Portlandavid  Ormond M.D.   On: 07/30/2014 18:47   [place] Art/ven Flow Abd Pelv Doppler  07/30/2014   CLINICAL DATA:  Left lower quadrant pain for 1 month. History of ovarian cysts. Status post hysterectomy.  EXAM: TRANSABDOMINAL AND TRANSVAGINAL ULTRASOUND OF PELVIS  DOPPLER ULTRASOUND OF OVARIES  TECHNIQUE: Both transabdominal and transvaginal ultrasound examinations of the pelvis were performed. Transabdominal technique was performed for global imaging of the pelvis including uterus, ovaries, adnexal regions, and pelvic cul-de-sac.  It was necessary to proceed with endovaginal exam following the transabdominal exam to visualize the ovaries to better advantage. Color and duplex Doppler ultrasound was utilized to evaluate blood flow to the ovaries.  COMPARISON:  06/13/2014  FINDINGS: Uterus  Surgically absent.  No pelvic masses.  Right ovary  Measurements: 2.5 cm x 1.9 cm x 1.8 cm small mildly echogenic focus measuring 13 mm without internal blood flow lies within the right ovary, likely a involuting or hemorrhagic cyst. No adnexal mass.  Left ovary  Measurements: 4.6 cm x 3 cm x 3.1 cm. Complex cyst arises from the left ovary measuring 2.1 cm in size. Cyst has internal debris suggesting a hemorrhagic cyst. Remainder the ovaries unremarkable. No adnexal masses.  Pulsed Doppler evaluation of both ovaries demonstrates normal low-resistance arterial and venous waveforms.  Other findings  No free fluid.   IMPRESSION: 1. No evidence of ovarian torsion. 2. 2.1 cm complex left ovarian cyst likely a hemorrhagic cyst. 3. Probable complex 13 mm right ovarian cyst. 4. Status post hysterectomy.  No pelvic masses.  No free fluid.   Electronically Signed   By: Amie Portlandavid  Ormond M.D.   On: 07/30/2014 18:47    ED Course  Procedures (including critical care time) Labs Review Labs Reviewed  URINALYSIS, ROUTINE W REFLEX MICROSCOPIC  CBG MONITORING, ED    Imaging Review No results found.   EKG Interpretation None     7 PM pain improved after treatment with Norco. MDM  Patient wishes to be treated empirically for STDs as she may have been exposed. Rocephin Zithromax ordered. Final diagnoses:  None  . Plan prescriptions Norco, Flagyl. Blood pressure recheck one week She will contact her primary care physician tomorrow for referral to gynecologist. Diagnosis #1 ovarian cysts #2 vaginitis #3 hypertension     Doug SouSam Cashmere Harmes, MD 07/30/14 [id number]

## 2014-07-31 LAB — HIV ANTIBODY (ROUTINE TESTING W REFLEX)
HIV 1/O/2 Abs-Index Value: <1 (ref <1.00)
HIV-1/HIV-2 Ab: NONREACTIVE

## 2014-07-31 LAB — RPR: RPR: NONREACTIVE

## 2014-09-24 ENCOUNTER — Emergency Department (HOSPITAL_BASED_OUTPATIENT_CLINIC_OR_DEPARTMENT_OTHER)
Admission: EM | Admit: 2014-09-24 | Discharge: 2014-09-24 | Discharge status: Against Medical Advice | Payer: Medicaid Other | Attending: Emergency Medicine | Admitting: Emergency Medicine

## 2014-09-24 ENCOUNTER — Encounter (HOSPITAL_BASED_OUTPATIENT_CLINIC_OR_DEPARTMENT_OTHER): Payer: Self-pay | Admitting: *Deleted

## 2014-09-24 DIAGNOSIS — E119 Type 2 diabetes mellitus without complications: Secondary | ICD-10-CM | POA: Insufficient documentation

## 2014-09-24 DIAGNOSIS — N8320 Unspecified ovarian cysts: Secondary | ICD-10-CM | POA: Diagnosis not present

## 2014-09-24 DIAGNOSIS — R109 Unspecified abdominal pain: Secondary | ICD-10-CM | POA: Diagnosis not present

## 2014-09-24 DIAGNOSIS — I10 Essential (primary) hypertension: Secondary | ICD-10-CM | POA: Insufficient documentation

## 2014-09-24 DIAGNOSIS — Z720 Tobacco use: Secondary | ICD-10-CM | POA: Insufficient documentation

## 2014-09-24 LAB — URINALYSIS, ROUTINE W REFLEX MICROSCOPIC
Bilirubin Urine: NEGATIVE
Glucose, UA: NEGATIVE mg/dL
Ketones, ur: NEGATIVE mg/dL
Leukocytes, UA: NEGATIVE
NITRITE: NEGATIVE
Protein, ur: 30 mg/dL — AB
SPECIFIC GRAVITY, URINE: 1.019 (ref 1.005–1.030)
UROBILINOGEN UA: 0.2 mg/dL (ref 0.0–1.0)
pH: 5.5 (ref 5.0–8.0)

## 2014-09-24 LAB — URINE MICROSCOPIC-ADD ON

## 2014-09-24 NOTE — ED Notes (Signed)
Pt has a known cyst on her left ovary and is scheduled to [facility] but the pain has become worse. Pt playing games on cell phone rather than participating in triage process.

## 2014-09-24 NOTE — ED Notes (Signed)
Pt called second time to be taken to room with no answer. Pt not found in the ER area.

## 2014-09-24 NOTE — ED Notes (Signed)
Called for patient in waiting room, no answer.

## 2014-11-24 ENCOUNTER — Encounter (HOSPITAL_BASED_OUTPATIENT_CLINIC_OR_DEPARTMENT_OTHER): Payer: Self-pay

## 2014-11-24 ENCOUNTER — Emergency Department (HOSPITAL_BASED_OUTPATIENT_CLINIC_OR_DEPARTMENT_OTHER)
Admission: EM | Admit: 2014-11-24 | Discharge: 2014-11-24 | Disposition: A | Discharge status: Home / Self Care | Payer: Medicaid Other | Attending: Emergency Medicine | Admitting: Emergency Medicine

## 2014-11-24 ENCOUNTER — Emergency Department (HOSPITAL_BASED_OUTPATIENT_CLINIC_OR_DEPARTMENT_OTHER): Payer: Medicaid Other

## 2014-11-24 DIAGNOSIS — Z792 Long term (current) use of antibiotics: Secondary | ICD-10-CM | POA: Diagnosis not present

## 2014-11-24 DIAGNOSIS — R05 Cough: Secondary | ICD-10-CM | POA: Insufficient documentation

## 2014-11-24 DIAGNOSIS — R0789 Other chest pain: Secondary | ICD-10-CM | POA: Diagnosis not present

## 2014-11-24 DIAGNOSIS — E119 Type 2 diabetes mellitus without complications: Secondary | ICD-10-CM | POA: Diagnosis not present

## 2014-11-24 DIAGNOSIS — Z8742 Personal history of other diseases of the female genital tract: Secondary | ICD-10-CM | POA: Insufficient documentation

## 2014-11-24 DIAGNOSIS — I10 Essential (primary) hypertension: Secondary | ICD-10-CM | POA: Diagnosis not present

## 2014-11-24 DIAGNOSIS — R0602 Shortness of breath: Secondary | ICD-10-CM | POA: Insufficient documentation

## 2014-11-24 DIAGNOSIS — Z720 Tobacco use: Secondary | ICD-10-CM | POA: Insufficient documentation

## 2014-11-24 DIAGNOSIS — M549 Dorsalgia, unspecified: Secondary | ICD-10-CM | POA: Insufficient documentation

## 2014-11-24 DIAGNOSIS — R7989 Other specified abnormal findings of blood chemistry: Secondary | ICD-10-CM | POA: Diagnosis present

## 2014-11-24 DIAGNOSIS — R079 Chest pain, unspecified: Secondary | ICD-10-CM

## 2014-11-24 DIAGNOSIS — K219 Gastro-esophageal reflux disease without esophagitis: Secondary | ICD-10-CM | POA: Diagnosis not present

## 2014-11-24 DIAGNOSIS — E669 Obesity, unspecified: Secondary | ICD-10-CM | POA: Diagnosis not present

## 2014-11-24 DIAGNOSIS — Z79899 Other long term (current) drug therapy: Secondary | ICD-10-CM | POA: Insufficient documentation

## 2014-11-24 LAB — BASIC METABOLIC PANEL
Anion gap: 12 (ref 5–15)
BUN: 11 mg/dL (ref 6–20)
CO2: 26 mmol/L (ref 22–32)
Calcium: 9.8 mg/dL (ref 8.9–10.3)
Chloride: 104 mmol/L (ref 101–111)
Creatinine, Ser: 1.15 mg/dL — ABNORMAL HIGH (ref 0.44–1.00)
GFR calc Af Amer: >60 mL/min (ref >60)
GFR, EST NON AFRICAN AMERICAN: 59 mL/min — AB (ref >60)
GLUCOSE: 132 mg/dL — AB (ref 65–99)
POTASSIUM: 3.4 mmol/L — AB (ref 3.5–5.1)
Sodium: 142 mmol/L (ref 135–145)

## 2014-11-24 LAB — CBC WITH DIFFERENTIAL/PLATELET
BASOS PCT: 0 % (ref 0–1)
Basophils Absolute: 0 10*3/uL (ref 0.0–0.1)
EOS PCT: 2 % (ref 0–5)
Eosinophils Absolute: 0.2 10*3/uL (ref 0.0–0.7)
HEMATOCRIT: 43.1 % (ref 36.0–46.0)
HEMOGLOBIN: 13.8 g/dL (ref 12.0–15.0)
LYMPHS ABS: 3.4 10*3/uL (ref 0.7–4.0)
Lymphocytes Relative: 40 % (ref 12–46)
MCH: 28.5 pg (ref 26.0–34.0)
MCHC: 32 g/dL (ref 30.0–36.0)
MCV: 89 fL (ref 78.0–100.0)
MONOS PCT: 5 % (ref 3–12)
Monocytes Absolute: 0.5 10*3/uL (ref 0.1–1.0)
NEUTROS PCT: 53 % (ref 43–77)
Neutro Abs: 4.5 10*3/uL (ref 1.7–7.7)
Platelets: 215 10*3/uL (ref 150–400)
RBC: 4.84 MIL/uL (ref 3.87–5.11)
RDW: 14.5 % (ref 11.5–15.5)
WBC: 8.6 10*3/uL (ref 4.0–10.5)

## 2014-11-24 LAB — HEPATIC FUNCTION PANEL
ALBUMIN: 3.8 g/dL (ref 3.5–5.0)
ALT: 15 U/L (ref 14–54)
AST: 19 U/L (ref 15–41)
Alkaline Phosphatase: 68 U/L (ref 38–126)
Bilirubin, Direct: <0.1 mg/dL — ABNORMAL LOW (ref 0.1–0.5)
Total Bilirubin: 0.4 mg/dL (ref 0.3–1.2)
Total Protein: 7.6 g/dL (ref 6.5–8.1)

## 2014-11-24 LAB — TROPONIN I: Troponin I: <0.03 ng/mL (ref <0.031)

## 2014-11-24 LAB — LIPASE, BLOOD: Lipase: 38 U/L (ref 22–51)

## 2014-11-24 LAB — D-DIMER, QUANTITATIVE (NOT AT ARMC): D DIMER QUANT: 1.58 ug{FEU}/mL — AB (ref 0.00–0.48)

## 2014-11-24 MED ORDER — SODIUM CHLORIDE 0.9 % IV BOLUS (SEPSIS)
1000.0000 mL | Freq: Once | INTRAVENOUS | Status: AC
  Administered 2014-11-24: 1000 mL via INTRAVENOUS

## 2014-11-24 MED ORDER — IOHEXOL 300 MG/ML  SOLN: 100.0000 mL | Freq: Once | INTRAMUSCULAR | Status: DC | PRN

## 2014-11-24 MED ORDER — IOHEXOL 350 MG/ML SOLN
100.0000 mL | Freq: Once | INTRAVENOUS | Status: AC | PRN
  Administered 2014-11-24: 100 mL via INTRAVENOUS

## 2014-11-24 MED ORDER — IBUPROFEN 800 MG PO TABS
800.0000 mg | ORAL_TABLET | Freq: Once | ORAL | Status: AC
  Administered 2014-11-24: 800 mg via ORAL
  Filled 2014-11-24: qty 1

## 2014-11-24 NOTE — Discharge Instructions (Signed)

## 2014-11-24 NOTE — ED Provider Notes (Signed)
CSN: [id number]     Arrival date & time 11/24/14  1433 History   First MD Initiated Contact with Patient 11/24/14 1504     Chief Complaint  Patient presents with  . Abnormal Lab     (Consider location/radiation/quality/duration/timing/severity/associated sxs/prior Treatment) HPI  39 year old female presents at the request of her PCP due to an elevated d-dimer. The patient states she's had intermittent right-sided chest and back pain for the past 1 month. 2 months ago she had surgery. Has had an intermittent cough as well as dyspnea during that time. No focal leg swelling or leg pain. No prior history of blood clots. The patient states the pain is worse with inspiration but also increases after she eats. No abdominal pain. She feels like there is a knot in the middle of her chest but had a chest x-ray at [facility] that was negative. The patient one week ago had a d-dimer drawn and was called about it yesterday. The patient has been started on hydrocodone with some relief. Encouraged by PCP to come here for a CT scan.  Past Medical History  Diagnosis Date  . Hypertension   . GERD (gastroesophageal reflux disease)   . Diabetes mellitus without complication   . Ovarian cyst    Past Surgical History  Procedure Laterality Date  . Abdominal hysterectomy    . Cholecystectomy     Family History  Problem Relation Age of Onset  . Lupus Mother   . Seizures Father    History  Substance Use Topics  . Smoking status: Current Every Day Smoker -- 0.50 packs/day for 20 years    Types: Cigarettes  . Smokeless tobacco: Never Used  . Alcohol Use: Yes     Comment: occasional   OB History    No data available     Review of Systems  Constitutional: Negative for fever.  Respiratory: Positive for cough and shortness of breath.   Cardiovascular: Positive for chest pain. Negative for leg swelling.  Gastrointestinal: Negative for abdominal pain.  Musculoskeletal: Positive for back pain.    All other systems reviewed and are negative.     Allergies  Review of patient's allergies indicates no known allergies.  Home Medications   Prior to Admission medications   Medication Sig Start Date End Date Taking? Authorizing Provider  amLODipine (NORVASC) 10 MG tablet Take 10 mg by mouth daily.    Historical Provider, MD  amoxicillin-clavulanate (AUGMENTIN) 500-125 MG per tablet Take 1 tablet (500 mg total) by mouth 3 (three) times daily. 12/23/13   Myeong O Sheard, DPM  atenolol-chlorthalidone (TENORETIC) 100-25 MG per tablet Take by mouth.    Historical Provider, MD  buPROPion (WELLBUTRIN XL) 300 MG 24 hr tablet Take 300 mg by mouth daily.    Historical Provider, MD  carisoprodol (SOMA) 350 MG tablet Take 350 mg by mouth 4 (four) times daily as needed.    Historical Provider, MD  cyclobenzaprine (FLEXERIL) 10 MG tablet Take 1 tablet (10 mg total) by mouth 2 (two) times daily as needed for muscle spasms. 10/31/13   Tammy Triplett, PA-C  gabapentin (NEURONTIN) 600 MG tablet Take 600 mg by mouth 2 (two) times daily.    Historical Provider, MD  HYDROcodone-acetaminophen (NORCO) 5-325 MG per tablet Take 1-2 tablets by mouth every 6 (six) hours as needed for moderate pain or severe pain. 07/30/14   Doug Sou, MD  HYDROcodone-acetaminophen (NORCO/VICODIN) 5-325 MG per tablet Take 1 tablet by mouth every 4 (four) hours as needed.  07/30/14   Doug SouSam Jacubowitz, MD  Ibuprofen 200 MG CAPS Take 3 capsules (600 mg total) by mouth every 6 (six) hours as needed. 05/16/14   Su Hoffarly J Rivet, MD  LISINOPRIL PO Take by mouth.    Historical Provider, MD  losartan-hydrochlorothiazide (HYZAAR) 100-12.5 MG per tablet Take 1 tablet by mouth daily.    Historical Provider, MD  metFORMIN (GLUCOPHAGE) 500 MG tablet Take by mouth daily with breakfast.    Historical Provider, MD  metoprolol succinate (TOPROL-XL) 25 MG 24 hr tablet Take 25 mg by mouth daily.    Historical Provider, MD  metroNIDAZOLE (FLAGYL) 500 MG  tablet Take 1 tablet (500 mg total) by mouth 2 (two) times daily. Do not drink alcohol with this medication. 07/11/13   [name], DO  metroNIDAZOLE (FLAGYL) 500 MG tablet Take 1 tablet (500 mg total) by mouth 2 (two) times daily. 07/30/14   Doug SouSam Jacubowitz, MD  Multiple Vitamin (MULITIVITAMIN WITH MINERALS) TABS Take 1 tablet by mouth daily.    Historical Provider, MD  Omeprazole (PRILOSEC PO) Take 1 capsule by mouth daily.    Historical Provider, MD  oxyCODONE-acetaminophen (PERCOCET) 10-325 MG per tablet Take 1 tablet by mouth every 6 (six) hours as needed for pain. 05/16/14   Carly Arlyce HarmanJ Rivet, MD  pantoprazole (PROTONIX) 40 MG tablet Take 40 mg by mouth.    Historical Provider, MD  phenazopyridine (PYRIDIUM) 95 MG tablet Take 1 tablet (95 mg total) by mouth 3 (three) times daily as needed for pain. 07/11/13   [name], DO  phentermine 37.5 MG capsule Take 37.5 mg by mouth every morning.    Historical Provider, MD  pravastatin (PRAVACHOL) 20 MG tablet Take 20 mg by mouth daily.    Historical Provider, MD  PRESCRIPTION MEDICATION Take 1 tablet by mouth daily. Blood pressure medication    Historical Provider, MD  promethazine (PHENERGAN) 25 MG tablet Take 1 tablet (25 mg total) by mouth every 6 (six) hours as needed for nausea or vomiting. 03/24/14   Jerelyn ScottMartha Linker, MD  traMADol (ULTRAM) 50 MG tablet Take 1 tablet (50 mg total) by mouth every 6 (six) hours as needed. 05/30/14   Lorre NickAnthony Allen, MD   BP 159/96 mmHg  Pulse 77  Temp(Src) 99.8 F (37.7 C) (Oral)  Resp 18  Ht 5\' 10"  (1.778 m)  Wt 265 lb (120.203 kg)  BMI 38.02 kg/m2  SpO2 100% Physical Exam  Constitutional: She is oriented to person, place, and time. She appears well-developed and well-nourished.  Obese  HENT:  Head: Normocephalic and atraumatic.  Right Ear: External ear normal.  Left Ear: External ear normal.  Nose: Nose normal.  Eyes: Right eye exhibits no discharge. Left eye exhibits no discharge.  Neck: Neck supple.    Cardiovascular: Normal rate, regular rhythm and normal heart sounds.   Pulmonary/Chest: Effort normal and breath sounds normal.   She exhibits tenderness.    Abdominal: Soft. She exhibits no distension. There is no tenderness.  Musculoskeletal: She exhibits no edema or tenderness.  Neurological: She is alert and oriented to person, place, and time.  Skin: Skin is warm and dry.  Nursing note and vitals reviewed.   ED Course  Procedures (including critical care time) Labs Review Labs Reviewed  BASIC METABOLIC PANEL - Abnormal; Notable for the following:    Potassium 3.4 (*)    Glucose, Bld 132 (*)    Creatinine, Ser 1.15 (*)    GFR calc non Af Amer 59 (*)  All other components within normal limits  D-DIMER, QUANTITATIVE - Abnormal; Notable for the following:    D-Dimer, Quant 1.58 (*)    All other components within normal limits  HEPATIC FUNCTION PANEL - Abnormal; Notable for the following:    Bilirubin, Direct <0.1 (*)    All other components within normal limits  CBC WITH DIFFERENTIAL/PLATELET  LIPASE, BLOOD  TROPONIN I    Imaging Review Ct Angio Chest Pe W/cm &/or Wo Cm  11/24/2014   CLINICAL DATA:  39 year old female with chest pain radiating to the back. Shortness of breath, fatigue and cough. Recent ovarian surgery 6 weeks ago. Initial encounter.  EXAM: CT ANGIOGRAPHY CHEST WITH CONTRAST  TECHNIQUE: Multidetector CT imaging of the chest was performed using the standard protocol during bolus administration of intravenous contrast. Multiplanar CT image reconstructions and MIPs were obtained to evaluate the vascular anatomy.  CONTRAST:  100mL OMNIPAQUE IOHEXOL 350 MG/ML SOLN  COMPARISON:  [facility] 11/28/2011.  FINDINGS: Large body habitus. Suboptimal contrast bolus timing in the pulmonary arterial tree. Main pulmonary artery contrast density is approximately 150 Hounsfield units. No central pulmonary artery filling defect identified. No hilar  filling defect identified. The more distal pulmonary artery evaluation is limited, also by a mild degree of respiratory motion artifact.  Major airways are patent. Stable lung volumes. Mild dependent pulmonary atelectasis. No confluent pulmonary opacity or pleural effusion.  Cardiomegaly. No pericardial effusion. Stable visualized aorta, negative except for mild arch calcified plaque. Negative thoracic inlet. No mediastinal or hilar lymphadenopathy.  Stable and negative visualized liver, spleen, and stomach.  Degenerative changes in the thoracic spine, bulky disc osteophyte complex versus ossification of the posterior longitudinal ligament at T8-T9, with at least moderate spinal stenosis. Probable posttraumatic or less likely degenerative overgrowth at the bilateral T4 and right T3 transverse processes. No acute osseous abnormality identified.  Review of the MIP images confirms the above findings.  IMPRESSION: 1. Suboptimal contrast bolus timing limiting evaluation of the distal pulmonary arteries. No central or hilar pulmonary embolus. 2. Chronic mild cardiomegaly. No pericardial or pleural effusion. Minor pulmonary atelectasis. 3. Chronic degenerative and/or posttraumatic changes in the thoracic spine. Up to moderate chronic spinal stenosis at T9-T10.   Electronically Signed   By: Odessa FlemingH  Hall M.D.   On: 11/24/2014 16:23     EKG Interpretation   Date/Time:  Tuesday Nov 24 2014 14:40:02 EDT Ventricular Rate:  73 PR Interval:  162 QRS Duration: 86 QT Interval:  378 QTC Calculation: 416 R Axis:   44 Text Interpretation:  Normal sinus rhythm T wave abnormality, consider  inferolateral ischemia Abnormal ECG No significant change since Sept 2015  Reconfirmed by Sharanya Templin  MD, Chandelle Harkey ([id number]) on 11/24/2014 3:06:44 PM      MDM   Final diagnoses:  Right-sided chest pain    Patient's CT scan is not optimal but shows no obvious pulmonary embolus. No other obvious cause of right-sided chest pain. Does have  abnormal T waves but this is unchanged from one year ago. No signs of ACS and her symptoms of one month of pain are unlikely to be ACS related. He has been having an intermittent/chronic cough and this could be causing a muscle strain given that she is quite tender over this area. I recommended she continue ibuprofen as well as the medicines prescribed by her PCP and follow-up closely with PCP for further outpatient workup of her chest pain.    Pricilla LovelessScott Lativia Velie, MD 11/24/14 [phone]

## 2014-11-24 NOTE — ED Notes (Signed)
Pt was seen one week by PCP-elevated d-dimer-pt states she has been having CP x 1.5+ months with a "knot" on chest-[facility]

## 2014-11-24 NOTE — ED Notes (Signed)
Pt states that she called her doctor to check on lab results and was told to come to ed for eval of increased d dimer.

## 2015-08-25 ENCOUNTER — Emergency Department (HOSPITAL_BASED_OUTPATIENT_CLINIC_OR_DEPARTMENT_OTHER)
Admission: EM | Admit: 2015-08-25 | Discharge: 2015-08-25 | Disposition: A | Discharge status: Home / Self Care | Payer: Medicaid Other | Attending: Emergency Medicine | Admitting: Emergency Medicine

## 2015-08-25 ENCOUNTER — Encounter (HOSPITAL_BASED_OUTPATIENT_CLINIC_OR_DEPARTMENT_OTHER): Payer: Self-pay

## 2015-08-25 DIAGNOSIS — E119 Type 2 diabetes mellitus without complications: Secondary | ICD-10-CM | POA: Insufficient documentation

## 2015-08-25 DIAGNOSIS — H109 Unspecified conjunctivitis: Secondary | ICD-10-CM

## 2015-08-25 DIAGNOSIS — Z79899 Other long term (current) drug therapy: Secondary | ICD-10-CM | POA: Insufficient documentation

## 2015-08-25 DIAGNOSIS — Z7984 Long term (current) use of oral hypoglycemic drugs: Secondary | ICD-10-CM | POA: Insufficient documentation

## 2015-08-25 DIAGNOSIS — I10 Essential (primary) hypertension: Secondary | ICD-10-CM | POA: Insufficient documentation

## 2015-08-25 DIAGNOSIS — Z8619 Personal history of other infectious and parasitic diseases: Secondary | ICD-10-CM | POA: Insufficient documentation

## 2015-08-25 DIAGNOSIS — F17210 Nicotine dependence, cigarettes, uncomplicated: Secondary | ICD-10-CM | POA: Insufficient documentation

## 2015-08-25 DIAGNOSIS — Z8719 Personal history of other diseases of the digestive system: Secondary | ICD-10-CM | POA: Insufficient documentation

## 2015-08-25 DIAGNOSIS — Z8742 Personal history of other diseases of the female genital tract: Secondary | ICD-10-CM | POA: Insufficient documentation

## 2015-08-25 HISTORY — DX: Gonococcal infection, unspecified: A54.9

## 2015-08-25 HISTORY — DX: Unspecified viral hepatitis C without hepatic coma: B19.20

## 2015-08-25 HISTORY — DX: Herpesviral infection of urogenital system, unspecified: A60.00

## 2015-08-25 MED ORDER — ERYTHROMYCIN 5 MG/GM OP OINT
TOPICAL_OINTMENT | Freq: Once | OPHTHALMIC | Status: AC
  Administered 2015-08-25: 13:00:00 via OPHTHALMIC
  Filled 2015-08-25: qty 3.5

## 2015-08-25 MED ORDER — ERYTHROMYCIN 5 MG/GM OP OINT: TOPICAL_OINTMENT | OPHTHALMIC | Status: DC

## 2015-08-25 NOTE — ED Notes (Signed)
C/o eye d/c and redness to left eye x 3 weeks-NSD-steady gait

## 2015-08-25 NOTE — ED Provider Notes (Signed)
CSN: [id number]     Arrival date & time 08/25/15  1151 History   First MD Initiated Contact with Patient 08/25/15 1251     Chief Complaint  Patient presents with  . Eye Drainage     (Consider location/radiation/quality/duration/timing/severity/associated sxs/prior Treatment) HPI   Christine Callahan is a 40 y.o. female, with a history of genital herpes, gonorrhea, and hepatitis C, presenting to the ED with left eye irritation and watery discharge for the last three weeks. Pt also complains of eye redness. Pt does not wear contacts. Pt complains of white crusting upon wakening. Pt denies N/V, fever/chills, changes in vision, headache, or any other complaints. No history of HIV.    Past Medical History  Diagnosis Date  . Hypertension   . GERD (gastroesophageal reflux disease)   . Diabetes mellitus without complication (HCC)   . Ovarian cyst   . Genital herpes 2013  . Gonorrhea 2015  . Hepatitis C 2016   Past Surgical History  Procedure Laterality Date  . Abdominal hysterectomy    . Cholecystectomy     Family History  Problem Relation Age of Onset  . Lupus Mother   . Seizures Father    Social History  Substance Use Topics  . Smoking status: Current Every Day Smoker -- 0.50 packs/day for 20 years    Types: Cigarettes  . Smokeless tobacco: Never Used  . Alcohol Use: Yes     Comment: occasional   OB History    No data available     Review of Systems  Constitutional: Negative for fever, chills and diaphoresis.  Eyes: Positive for discharge, redness and itching. Negative for photophobia and visual disturbance.  Neurological: Negative for dizziness, light-headedness and headaches.  All other systems reviewed and are negative.     Allergies  Review of patient's allergies indicates no known allergies.  Home Medications   Prior to Admission medications   Medication Sig Start Date End Date Taking? Authorizing Provider  atenolol-chlorthalidone (TENORETIC) 100-25 MG per  tablet Take by mouth.    Historical Provider, MD  erythromycin ophthalmic ointment Place a 1/2 inch ribbon of ointment into the lower eyelid. Apply 4 times daily for the next 5 days. 08/25/15   Acxel Dingee C Kamori Barbier, PA-C  gabapentin (NEURONTIN) 600 MG tablet Take 600 mg by mouth 2 (two) times daily.    Historical Provider, MD  LISINOPRIL PO Take by mouth.    Historical Provider, MD  metFORMIN (GLUCOPHAGE) 500 MG tablet Take by mouth daily with breakfast.    Historical Provider, MD  metoprolol succinate (TOPROL-XL) 25 MG 24 hr tablet Take 25 mg by mouth daily.    Historical Provider, MD   BP 135/85 mmHg  Pulse 68  Temp(Src) 98.8 F (37.1 C) (Oral)  Resp 18  Ht  (1.778 m)  Wt 117.935 kg  BMI 37.31 kg/m2  SpO2 100% Physical Exam  Constitutional: She appears well-developed and well-nourished. No distress.  HENT:  Head: Normocephalic and atraumatic.  Eyes: Conjunctivae and EOM are normal. Pupils are equal, round, and reactive to light.  Minimal scleral injection left eye. No discharge noted.  Neck: Normal range of motion. Neck supple.  Cardiovascular: Normal rate and regular rhythm.   Pulmonary/Chest: Effort normal.  Lymphadenopathy:    She has no cervical adenopathy.  Neurological: She is alert.  Skin: Skin is warm and dry. She is not diaphoretic.  Nursing note and vitals reviewed.   ED Course  Procedures (including critical care time)    Visual  Acuity  Right Eye Distance: 20/40 Left Eye Distance: 20/25 Bilateral Distance: 20/20  Right Eye Near:   Left Eye Near:    Bilateral Near:     MDM   Final diagnoses:  Conjunctivitis of left eye    Christine Callahan presents with left eye discharge and redness for the last 3 weeks.  Does not give suspicion for bacterial conjunctivitis, that is, patient does not have copious or purulent discharge, her eye redness is minimal, and she has no additional symptoms. There is no reduction in her visual acuity. Patient was given erythromycin  ointment and told to follow-up with ophthalmology should symptoms not improve. Patient was also advised to follow-up with ophthalmology should she change additional symptoms. Patient was given return precautions. Patient is comfortable with discharge.  Anselm Pancoast, PA-C 08/25/15 1720  Derwood Kaplan, MD 08/27/15 [phone]

## 2015-08-25 NOTE — Discharge Instructions (Signed)
You have been seen today for eye irritation, redness, and drainage. Follow up with ophthalmology should symptoms not improve within the next couple days. Go to ophthalmology if symptoms begin to worsen. Follow up with PCP as needed. You may always return to the ED should you have worsening symptoms or additional concerns. Use the erythromycin ointment 4 times a day for the next 5 days.

## 2015-09-20 ENCOUNTER — Emergency Department (HOSPITAL_BASED_OUTPATIENT_CLINIC_OR_DEPARTMENT_OTHER)
Admission: EM | Admit: 2015-09-20 | Discharge: 2015-09-20 | Disposition: A | Discharge status: Home / Self Care | Payer: Medicaid Other | Attending: Emergency Medicine | Admitting: Emergency Medicine

## 2015-09-20 ENCOUNTER — Emergency Department (HOSPITAL_BASED_OUTPATIENT_CLINIC_OR_DEPARTMENT_OTHER): Payer: Medicaid Other

## 2015-09-20 ENCOUNTER — Encounter (HOSPITAL_BASED_OUTPATIENT_CLINIC_OR_DEPARTMENT_OTHER): Payer: Self-pay | Admitting: *Deleted

## 2015-09-20 DIAGNOSIS — Z8619 Personal history of other infectious and parasitic diseases: Secondary | ICD-10-CM | POA: Insufficient documentation

## 2015-09-20 DIAGNOSIS — Z8719 Personal history of other diseases of the digestive system: Secondary | ICD-10-CM | POA: Insufficient documentation

## 2015-09-20 DIAGNOSIS — Z8742 Personal history of other diseases of the female genital tract: Secondary | ICD-10-CM | POA: Insufficient documentation

## 2015-09-20 DIAGNOSIS — Z79899 Other long term (current) drug therapy: Secondary | ICD-10-CM | POA: Insufficient documentation

## 2015-09-20 DIAGNOSIS — R079 Chest pain, unspecified: Secondary | ICD-10-CM | POA: Insufficient documentation

## 2015-09-20 DIAGNOSIS — M79604 Pain in right leg: Secondary | ICD-10-CM | POA: Insufficient documentation

## 2015-09-20 DIAGNOSIS — I10 Essential (primary) hypertension: Secondary | ICD-10-CM | POA: Insufficient documentation

## 2015-09-20 DIAGNOSIS — F17210 Nicotine dependence, cigarettes, uncomplicated: Secondary | ICD-10-CM | POA: Insufficient documentation

## 2015-09-20 DIAGNOSIS — E119 Type 2 diabetes mellitus without complications: Secondary | ICD-10-CM | POA: Insufficient documentation

## 2015-09-20 DIAGNOSIS — Z7984 Long term (current) use of oral hypoglycemic drugs: Secondary | ICD-10-CM | POA: Insufficient documentation

## 2015-09-20 DIAGNOSIS — Z3202 Encounter for pregnancy test, result negative: Secondary | ICD-10-CM | POA: Insufficient documentation

## 2015-09-20 LAB — BASIC METABOLIC PANEL
Anion gap: 9 (ref 5–15)
BUN: 11 mg/dL (ref 6–20)
CO2: 25 mmol/L (ref 22–32)
Calcium: 9 mg/dL (ref 8.9–10.3)
Chloride: 104 mmol/L (ref 101–111)
Creatinine, Ser: 0.82 mg/dL (ref 0.44–1.00)
GFR calc Af Amer: >60 mL/min (ref >60)
GFR calc non Af Amer: >60 mL/min (ref >60)
Glucose, Bld: 104 mg/dL — ABNORMAL HIGH (ref 65–99)
Potassium: 3.4 mmol/L — ABNORMAL LOW (ref 3.5–5.1)
Sodium: 138 mmol/L (ref 135–145)

## 2015-09-20 LAB — PREGNANCY, URINE: Preg Test, Ur: NEGATIVE

## 2015-09-20 LAB — CBC WITH DIFFERENTIAL/PLATELET
Basophils Absolute: 0 10*3/uL (ref 0.0–0.1)
Basophils Relative: 0 %
Eosinophils Absolute: 0.2 10*3/uL (ref 0.0–0.7)
Eosinophils Relative: 2 %
HCT: 41.4 % (ref 36.0–46.0)
Hemoglobin: 13.5 g/dL (ref 12.0–15.0)
Lymphocytes Relative: 45 %
Lymphs Abs: 3.5 10*3/uL (ref 0.7–4.0)
MCH: 28.7 pg (ref 26.0–34.0)
MCHC: 32.6 g/dL (ref 30.0–36.0)
MCV: 87.9 fL (ref 78.0–100.0)
Monocytes Absolute: 0.5 10*3/uL (ref 0.1–1.0)
Monocytes Relative: 6 %
Neutro Abs: 3.7 10*3/uL (ref 1.7–7.7)
Neutrophils Relative %: 47 %
Platelets: 204 10*3/uL (ref 150–400)
RBC: 4.71 MIL/uL (ref 3.87–5.11)
RDW: 14.4 % (ref 11.5–15.5)
WBC: 7.8 10*3/uL (ref 4.0–10.5)

## 2015-09-20 MED ORDER — ONDANSETRON HCL 4 MG/2ML IJ SOLN
4.0000 mg | Freq: Once | INTRAMUSCULAR | Status: AC
  Administered 2015-09-20: 4 mg via INTRAVENOUS
  Filled 2015-09-20: qty 2

## 2015-09-20 MED ORDER — MORPHINE SULFATE (PF) 4 MG/ML IV SOLN
4.0000 mg | Freq: Once | INTRAVENOUS | Status: AC
  Administered 2015-09-20: 4 mg via INTRAVENOUS
  Filled 2015-09-20: qty 1

## 2015-09-20 MED ORDER — PREDNISONE 50 MG PO TABS: 50.0000 mg | ORAL_TABLET | Freq: Every day | ORAL | Status: DC

## 2015-09-20 MED ORDER — HYDROCODONE-ACETAMINOPHEN 5-325 MG PO TABS: 1.0000 | ORAL_TABLET | Freq: Four times a day (QID) | ORAL | Status: DC | PRN

## 2015-09-20 NOTE — Discharge Instructions (Signed)
Return here as needed.  Follow-up with your primary care doctor °

## 2015-09-20 NOTE — ED Notes (Signed)
Pt does not want prednisone prescription as she already has a prescription that she is not taking.

## 2015-09-20 NOTE — ED Provider Notes (Signed)
CSN: [id number]     Arrival date & time 09/20/15  1636 History   First MD Initiated Contact with Patient 09/20/15 1712     Chief Complaint  Patient presents with  . Chest Pain     (Consider location/radiation/quality/duration/timing/severity/associated sxs/prior Treatment) HPI Patient presents to the emergency department with leg pain and intermittent sharp chest pain.  The patient states this is been going on for several weeks.  Her doctor did a d-dimer which was elevated and sent her to the emergency department for further evaluation.  Patient states that she has not had any shortness of breath, nausea, vomiting, weakness, dizziness, fever, abdominal pain, dysuria, back pain, incontinence, hematemesis, bloody stool, near syncope or syncope.  The patient states that nothing seems make the condition better or worse.  Patient states she was seen at [facility] several days ago for similar symptoms and had a CT scan done of her chest which showed no pulmonary embolus Past Medical History  Diagnosis Date  . Hypertension   . GERD (gastroesophageal reflux disease)   . Diabetes mellitus without complication (HCC)   . Ovarian cyst   . Genital herpes 2013  . Gonorrhea 2015  . Hepatitis C 2016   Past Surgical History  Procedure Laterality Date  . Abdominal hysterectomy    . Cholecystectomy     Family History  Problem Relation Age of Onset  . Lupus Mother   . Seizures Father    Social History  Substance Use Topics  . Smoking status: Current Every Day Smoker -- 0.50 packs/day for 20 years    Types: Cigarettes  . Smokeless tobacco: Never Used  . Alcohol Use: Yes     Comment: occasional   OB History    No data available     Review of Systems   All other systems negative except as documented in the HPI. All pertinent positives and negatives as reviewed in the HPI.  Allergies  Review of patient's allergies indicates no known allergies.  Home Medications   Prior  to Admission medications   Medication Sig Start Date End Date Taking? Authorizing Provider  atenolol-chlorthalidone (TENORETIC) 100-25 MG per tablet Take by mouth.    Historical Provider, MD  erythromycin ophthalmic ointment Place a 1/2 inch ribbon of ointment into the lower eyelid. Apply 4 times daily for the next 5 days. 08/25/15   Shawn C Joy, PA-C  gabapentin (NEURONTIN) 600 MG tablet Take 600 mg by mouth 2 (two) times daily.    Historical Provider, MD  LISINOPRIL PO Take by mouth.    Historical Provider, MD  metFORMIN (GLUCOPHAGE) 500 MG tablet Take by mouth daily with breakfast.    Historical Provider, MD  metoprolol succinate (TOPROL-XL) 25 MG 24 hr tablet Take 25 mg by mouth daily.    Historical Provider, MD   BP 158/93 mmHg  Pulse 61  Temp(Src) 98.2 F (36.8 C) (Oral)  Resp 16  Ht 5\' 10"  (1.778 m)  Wt 117.935 kg  BMI 37.31 kg/m2  SpO2 95% Physical Exam  Constitutional: She is oriented to person, place, and time. She appears well-developed and well-nourished. No distress.  HENT:  Head: Normocephalic and atraumatic.  Mouth/Throat: Oropharynx is clear and moist.  Eyes: Pupils are equal, round, and reactive to light.  Neck: Normal range of motion. Neck supple.  Cardiovascular: Normal rate, regular rhythm and normal heart sounds.  Exam reveals no gallop and no friction rub.   No murmur heard. Pulmonary/Chest: Effort normal and breath sounds  normal. No respiratory distress. She has no wheezes.  Abdominal: Soft. Bowel sounds are normal. She exhibits no distension. There is no tenderness.  Musculoskeletal: She exhibits no edema.  Neurological: She is alert and oriented to person, place, and time. She exhibits normal muscle tone. Coordination normal.  Skin: Skin is warm and dry. No rash noted. No erythema.  Psychiatric: She has a normal mood and affect. Her behavior is normal.  Nursing note and vitals reviewed.   ED Course  Procedures (including critical care time) Labs  Review Labs Reviewed  BASIC METABOLIC PANEL - Abnormal; Notable for the following:    Potassium 3.4 (*)    Glucose, Bld 104 (*)    All other components within normal limits  CBC WITH DIFFERENTIAL/PLATELET  PREGNANCY, URINE    Imaging Review US Venous Img Lower Unilateral Left  09/20/2015  CLINICAL DATA:  Subacute onset of left lower extremity pain for 2 weeks. Elevated D-dimer. Initial encounter. EXAM: LEFT LOWER EXTREMITY VENOUS DOPPLER ULTRASOUND TECHNIQUE: Gray-scale sonography with graded compression, as well as color Doppler and duplex ultrasound were performed to evaluate the lower extremity deep venous systems from the level of the common femoral vein and including the common femoral, femoral, profunda femoral, popliteal and calf veins including the posterior tibial, peroneal and gastrocnemius veins when visible. The superficial great saphenous vein was also interrogated. Spectral Doppler was utilized to evaluate flow at rest and with distal augmentation maneuvers in the common femoral, femoral and popliteal veins. COMPARISON:  None. FINDINGS: Contralateral Common Femoral Vein: Respiratory phasicity is normal and symmetric with the symptomatic side. No evidence of thrombus. Normal compressibility. Common Femoral Vein: No evidence of thrombus. Normal compressibility, respiratory phasicity and response to augmentation. Saphenofemoral Junction: No evidence of thrombus. Normal compressibility and flow on color Doppler imaging. Profunda Femoral Vein: No evidence of thrombus. Normal compressibility and flow on color Doppler imaging. Femoral Vein: No evidence of thrombus. Normal compressibility, respiratory phasicity and response to augmentation. Popliteal Vein: No evidence of thrombus. Normal compressibility, respiratory phasicity and response to augmentation. Calf Veins: No evidence of thrombus. Normal compressibility and flow on color Doppler imaging. The peroneal vein is not well visualized on this  study. Superficial Great Saphenous Vein: No evidence of thrombus. Normal compressibility and flow on color Doppler imaging. Venous Reflux:  None. Other Findings:  None. IMPRESSION: No evidence of deep venous thrombosis. Electronically Signed   By: Roanna Raider M.D.   On: 09/20/2015 18:42   I have personally reviewed and evaluated these images and lab results as part of my medical decision-making.  The patient has no DVT noted on ultrasound of the lower extremity and her negative CTA of her chest several days ago it is reassuring. the patient is advised plan and all questions were answered  Charlestine Night, PA-C 09/22/15 0115  Rolan Bucco, MD 09/29/15 [phone]

## 2015-09-20 NOTE — ED Notes (Signed)
Pt c/o left leg pain "sharp" at times-off and on x 2 weeks. Also has occasional sharp CP which accompanies this at times. Denies other s/s. Playing app on phone at time of assessment.

## 2015-09-20 NOTE — ED Notes (Signed)
Pt verbalizes understanding of d/c instructions and denies any further need at this time. 

## 2015-09-20 NOTE — ED Notes (Signed)
Pt reports left leg pain x 2 weeks.  Reports CP and SOB x several days.  Pt A/O in triage.  Ambulatory.  Confirmed elevated D-dimer at PCP.

## 2015-09-20 NOTE — ED Notes (Signed)
Ultrasound here to take pt. Pt requesting ultrasound to be done prior to receiving IV and labs.

## 2015-09-20 NOTE — ED Notes (Signed)
Patient transported to Ultrasound 

## 2015-09-21 MED FILL — HYDROCODON-APAP 5-325: 5-325 | 3 days supply | Qty: 15 | Fill #0

## 2015-11-07 ENCOUNTER — Emergency Department (HOSPITAL_BASED_OUTPATIENT_CLINIC_OR_DEPARTMENT_OTHER)
Admission: EM | Admit: 2015-11-07 | Discharge: 2015-11-08 | Disposition: A | Discharge status: Home / Self Care | Payer: Medicaid Other | Attending: Emergency Medicine | Admitting: Emergency Medicine

## 2015-11-07 ENCOUNTER — Encounter (HOSPITAL_BASED_OUTPATIENT_CLINIC_OR_DEPARTMENT_OTHER): Payer: Self-pay | Admitting: *Deleted

## 2015-11-07 DIAGNOSIS — Z79899 Other long term (current) drug therapy: Secondary | ICD-10-CM | POA: Insufficient documentation

## 2015-11-07 DIAGNOSIS — I10 Essential (primary) hypertension: Secondary | ICD-10-CM | POA: Insufficient documentation

## 2015-11-07 DIAGNOSIS — F17210 Nicotine dependence, cigarettes, uncomplicated: Secondary | ICD-10-CM | POA: Insufficient documentation

## 2015-11-07 DIAGNOSIS — M545 Low back pain: Secondary | ICD-10-CM | POA: Insufficient documentation

## 2015-11-07 DIAGNOSIS — Z7984 Long term (current) use of oral hypoglycemic drugs: Secondary | ICD-10-CM | POA: Insufficient documentation

## 2015-11-07 DIAGNOSIS — M549 Dorsalgia, unspecified: Secondary | ICD-10-CM

## 2015-11-07 DIAGNOSIS — E119 Type 2 diabetes mellitus without complications: Secondary | ICD-10-CM | POA: Insufficient documentation

## 2015-11-07 LAB — URINALYSIS, ROUTINE W REFLEX MICROSCOPIC
Bilirubin Urine: NEGATIVE
GLUCOSE, UA: NEGATIVE mg/dL
Hgb urine dipstick: NEGATIVE
Ketones, ur: NEGATIVE mg/dL
LEUKOCYTES UA: NEGATIVE
NITRITE: NEGATIVE
PH: 5 (ref 5.0–8.0)
Protein, ur: NEGATIVE mg/dL
SPECIFIC GRAVITY, URINE: 1.025 (ref 1.005–1.030)

## 2015-11-07 LAB — CBG MONITORING, ED: Glucose-Capillary: 193 mg/dL — ABNORMAL HIGH (ref 65–99)

## 2015-11-07 MED ORDER — FLUCONAZOLE 50 MG PO TABS
150.0000 mg | ORAL_TABLET | Freq: Once | ORAL | Status: AC
  Administered 2015-11-07: 150 mg via ORAL
  Filled 2015-11-07: qty 1

## 2015-11-07 MED ORDER — CYCLOBENZAPRINE HCL 10 MG PO TABS: 10.0000 mg | ORAL_TABLET | Freq: Three times a day (TID) | ORAL | Status: DC | PRN

## 2015-11-07 MED ORDER — HYDROCODONE-ACETAMINOPHEN 5-325 MG PO TABS
1.0000 | ORAL_TABLET | Freq: Once | ORAL | Status: AC
Start: 2015-11-07 — End: 2015-11-07
  Administered 2015-11-07: 1 via ORAL
  Filled 2015-11-07: qty 1

## 2015-11-07 MED ORDER — HYDROCODONE-ACETAMINOPHEN 5-325 MG PO TABS: 1.0000 | ORAL_TABLET | Freq: Four times a day (QID) | ORAL | Status: DC | PRN

## 2015-11-07 MED ORDER — CYCLOBENZAPRINE HCL 10 MG PO TABS
10.0000 mg | ORAL_TABLET | Freq: Once | ORAL | Status: AC
  Administered 2015-11-07: 10 mg via ORAL
  Filled 2015-11-07: qty 1

## 2015-11-07 NOTE — ED Provider Notes (Signed)
CSN: [id number]     Arrival date & time 11/07/15  2159 History  By signing my name below, I, [facility]arr[facility] Washington, attest that this documentation has been prepared under the direction and in the presence of Paula LibraJohn Esaw Knippel, MD. Electronically Signed: Randell PatientMarrissa Washington, ED Scribe. 11/07/2015. 11:39 PM.   Chief Complaint  Patient presents with  . Flank Pain   The history is provided by the patient. No language interpreter was used.  HPI Comments: Christine Callahan is a 40 y.o. female with an hx of DM who presents to the Emergency Department complaining of dull left lower back pain onset yesterday. She rated her pain a 10 out of 10 on arrival. Patient reports no recent injuries or falls. Denies pain radiation down the leg. Pain worse with movement and palpation and she feels like there is a "knot" in her back. She was seen by her PCP recently for dysuria and was diagnosed with a UTI and prescribed antibiotics which she is still taking. The dysuria has improved but she is having some vulvovaginal itching. She denies vaginal bleeding or discharge.  Past Medical History  Diagnosis Date  . Hypertension   . GERD (gastroesophageal reflux disease)   . Diabetes mellitus without complication (HCC)   . Ovarian cyst   . Genital herpes 2013  . Gonorrhea 2015  . Hepatitis C 2016   Past Surgical History  Procedure Laterality Date  . Abdominal hysterectomy    . Cholecystectomy     Family History  Problem Relation Age of Onset  . Lupus Mother   . Seizures Father    Social History  Substance Use Topics  . Smoking status: Current Every Day Smoker -- 0.50 packs/day for 20 years    Types: Cigarettes  . Smokeless tobacco: Never Used  . Alcohol Use: Yes     Comment: occasional   OB History    No data available     Review of Systems A complete 10 system review of systems was obtained and all systems are negative except as noted in the HPI and PMH.   Allergies  Review of patient's allergies indicates  no known allergies.  Home Medications   Prior to Admission medications   Medication Sig Start Date End Date Taking? Authorizing Provider  atenolol-chlorthalidone (TENORETIC) 100-25 MG per tablet Take by mouth.    Historical Provider, MD  cyclobenzaprine (FLEXERIL) 10 MG tablet Take 1 tablet (10 mg total) by mouth 3 (three) times daily as needed for muscle spasms. 11/07/15   Analaura Messler, MD  erythromycin ophthalmic ointment Place a 1/2 inch ribbon of ointment into the lower eyelid. Apply 4 times daily for the next 5 days. 08/25/15   Shawn C Joy, PA-C  gabapentin (NEURONTIN) 600 MG tablet Take 600 mg by mouth 2 (two) times daily.    Historical Provider, MD  HYDROcodone-acetaminophen (NORCO/VICODIN) 5-325 MG tablet Take 1-2 tablets by mouth every 6 (six) hours as needed for moderate pain. 11/07/15   Zriyah Kopplin, MD  LISINOPRIL PO Take by mouth.    Historical Provider, MD  metFORMIN (GLUCOPHAGE) 500 MG tablet Take by mouth daily with breakfast.    Historical Provider, MD  metoprolol succinate (TOPROL-XL) 25 MG 24 hr tablet Take 25 mg by mouth daily.    Historical Provider, MD  predniSONE (DELTASONE) 50 MG tablet Take 1 tablet (50 mg total) by mouth daily. 09/20/15   Christopher Lawyer, PA-C   BP 182/115 mmHg  Pulse 92  Temp(Src) 98.3 F (36.8 C) (Oral)  Resp 18  Ht  (1.778 m)  Wt 270 lb (122.471 kg)  BMI 38.74 kg/m2  SpO2 99% Physical Exam  Nursing note and vitals reviewed. General: Well-developed, obese female in no acute distress; appearance consistent with age of record HENT: normocephalic; atraumatic Eyes: pupils equal, round and reactive to light; extraocular muscles intact Neck: supple Heart: regular rate and rhythm Lungs: clear to auscultation bilaterally Abdomen: soft; obese; nontender Back: Left upper lumbar paraspinal soft tissue tenderness with palpable muscle spasm Extremities: No deformity; full range of motion; pulses normal Neurologic: Awake, alert and oriented; motor  function intact in all extremities and symmetric; no facial droop Skin: Warm and dry Psychiatric: Normal mood and affect   ED Course  Procedures   DIAGNOSTIC STUDIES: Oxygen Saturation is 99% on RA, normal by my interpretation.      MDM   Nursing notes and vitals signs, including pulse oximetry, reviewed.  Summary of this visit's results, reviewed by myself:  Labs:  Results for orders placed or performed during the hospital encounter of 11/07/15 (from the past 24 hour(s))  CBG monitoring, ED     Status: Abnormal   Collection Time: 11/07/15 10:15 PM  Result Value Ref Range   Glucose-Capillary 193 (H) 65 - 99 mg/dL  Urinalysis, Routine w reflex microscopic (not at [facility])     Status: None   Collection Time: 11/07/15 10:35 PM  Result Value Ref Range   Color, Urine YELLOW YELLOW   APPearance CLEAR CLEAR   Specific Gravity, Urine 1.025 1.005 - 1.030   pH 5.0 5.0 - 8.0   Glucose, UA NEGATIVE NEGATIVE mg/dL   Hgb urine dipstick NEGATIVE NEGATIVE   Bilirubin Urine NEGATIVE NEGATIVE   Ketones, ur NEGATIVE NEGATIVE mg/dL   Protein, ur NEGATIVE NEGATIVE mg/dL   Nitrite NEGATIVE NEGATIVE   Leukocytes, UA NEGATIVE NEGATIVE   Given that patient is a diabetic and is on an antibiotic we will treat for presumptive yeast infection.  Final diagnoses:  Mid back pain on left side   I personally performed the services described in this documentation, which was scribed in my presence.  The recorded information has been reviewed and considered.    Paula Libra, MD 11/07/15 [phone]

## 2015-11-07 NOTE — ED Notes (Signed)
Pt states left flank pain x 2 days, urinary frequency and burning for about 1 month. Pt denies fevers. C/o headache.

## 2015-11-24 MED FILL — traMADol HCL 50 MG TABS: 50 | 14 days supply | Qty: 30 | Fill #0

## 2016-01-19 ENCOUNTER — Telehealth: Payer: Self-pay | Admitting: Cardiology

## 2016-01-19 NOTE — Telephone Encounter (Signed)
Received records from [facility] for appointment on 01/26/16 with Dr Jens Somrenshaw.  Records given to [facility] Hines (medical records) for Dr Ludwig Clarksrenshaw's schedule on 01/26/16. lp

## 2016-01-24 NOTE — Progress Notes (Signed)
Cardiology Office Note    Date:  01/24/2016   ID:  Christine Callahan, DOB 1976/06/18, MRN [id number]  PCP:  Jackie PlumSEI-BONSU,GEORGE, MD  Cardiologist:  Olga MillersBrian Marjie Chea, MD   Chief Complaint  Patient presents with  . Palpitations    New patient evaluation    History of Present Illness:  Christine Callahan is a 40 y.o. female for evaluation of palpitations.     Past Medical History  Diagnosis Date  . Hypertension   . GERD (gastroesophageal reflux disease)   . Diabetes mellitus without complication (HCC)   . Ovarian cyst   . Genital herpes 2013  . Gonorrhea 2015  . Hepatitis C 2016    Past Surgical History  Procedure Laterality Date  . Abdominal hysterectomy    . Cholecystectomy      Current Medications: Outpatient Prescriptions Prior to Visit  Medication Sig Dispense Refill  . atenolol-chlorthalidone (TENORETIC) 100-25 MG per tablet Take by mouth.    . cyclobenzaprine (FLEXERIL) 10 MG tablet Take 1 tablet (10 mg total) by mouth 3 (three) times daily as needed for muscle spasms. 21 tablet 0  . erythromycin ophthalmic ointment Place a 1/2 inch ribbon of ointment into the lower eyelid. Apply 4 times daily for the next 5 days. 1 g 0  . gabapentin (NEURONTIN) 600 MG tablet Take 600 mg by mouth 2 (two) times daily.    Marland Kitchen. HYDROcodone-acetaminophen (NORCO/VICODIN) 5-325 MG tablet Take 1-2 tablets by mouth every 6 (six) hours as needed for moderate pain. 10 tablet 0  . LISINOPRIL PO Take by mouth.    . metFORMIN (GLUCOPHAGE) 500 MG tablet Take by mouth daily with breakfast.    . metoprolol succinate (TOPROL-XL) 25 MG 24 hr tablet Take 25 mg by mouth daily.    . predniSONE (DELTASONE) 50 MG tablet Take 1 tablet (50 mg total) by mouth daily. 5 tablet 0   No facility-administered medications prior to visit.     Allergies:   Review of patient's allergies indicates no known allergies.   Social History   Social History  . Marital Status: Single    Spouse Name: N/A  . Number of  Children: N/A  . Years of Education: N/A   Social History Main Topics  . Smoking status: Current Every Day Smoker -- 0.50 packs/day for 20 years    Types: Cigarettes  . Smokeless tobacco: Never Used  . Alcohol Use: Yes     Comment: occasional  . Drug Use: No  . Sexual Activity: Not on file   Other Topics Concern  . Not on file   Social History Narrative     Family History:  The patient's family history includes Lupus in her mother; Seizures in her father.   ROS:   Please see the history of present illness.    No weight loss, productive cough, hemoptysis, dysphagia, odynophagia, melena, hematochezia, dysuria, hematuria, rash, seizure activity, orthopnea, PND, pedal edema, claudication. All remaining systems negative.   PHYSICAL EXAM:   VS:  There were no vitals taken for this visit.   GEN: Well nourished, well developed, in no acute distress HEENT: normal Neck: no JVD, carotid bruits, or masses Cardiac: RRR; no murmurs, rubs, or gallops,no edema  Respiratory:  clear to auscultation bilaterally, normal work of breathing GI: soft, nontender, nondistended, + BS MS: no deformity or atrophy Skin: warm and dry, no rash Neuro:  Alert and Oriented x 3, Strength and sensation are intact Psych: euthymic mood, full affect  Wt Readings from  Last 3 Encounters:  11/07/15 270 lb (122.471 kg)  09/20/15 260 lb (117.935 kg)  08/25/15 260 lb (117.935 kg)      Studies/Labs Reviewed:   EKG:  EKG - 09/20/2015 Sinus rhythm, inferior lateral T-wave inversion  Recent Labs: 09/20/2015: BUN 11; Creatinine, Ser 0.82; Hemoglobin 13.5; Platelets 204; Potassium 3.4*; Sodium 138       A/P  1     Medication Adjustments/Labs and Tests Ordered: Current medicines are reviewed at length with the patient today.  Concerns regarding medicines are outlined above.  Medication changes, Labs and Tests ordered today are listed in the Patient Instructions below. There are no Patient Instructions on  file for this visit.   Signed, Olga Millers, MD  01/24/2016 9:08 AM    [facility]    This encounter was created in error - please disregard.

## 2016-01-26 ENCOUNTER — Encounter: Payer: Medicaid Other | Admitting: Cardiology

## 2016-01-28 ENCOUNTER — Ambulatory Visit: Payer: Medicaid Other | Admitting: Interventional Cardiology

## 2016-02-07 ENCOUNTER — Encounter (HOSPITAL_BASED_OUTPATIENT_CLINIC_OR_DEPARTMENT_OTHER): Payer: Self-pay | Admitting: *Deleted

## 2016-02-07 ENCOUNTER — Emergency Department (HOSPITAL_BASED_OUTPATIENT_CLINIC_OR_DEPARTMENT_OTHER): Payer: Self-pay

## 2016-02-07 ENCOUNTER — Emergency Department (HOSPITAL_BASED_OUTPATIENT_CLINIC_OR_DEPARTMENT_OTHER)
Admission: EM | Admit: 2016-02-07 | Discharge: 2016-02-07 | Disposition: A | Discharge status: Home / Self Care | Payer: Self-pay | Attending: Emergency Medicine | Admitting: Emergency Medicine

## 2016-02-07 DIAGNOSIS — F17210 Nicotine dependence, cigarettes, uncomplicated: Secondary | ICD-10-CM | POA: Insufficient documentation

## 2016-02-07 DIAGNOSIS — B9689 Other specified bacterial agents as the cause of diseases classified elsewhere: Secondary | ICD-10-CM

## 2016-02-07 DIAGNOSIS — I10 Essential (primary) hypertension: Secondary | ICD-10-CM | POA: Insufficient documentation

## 2016-02-07 DIAGNOSIS — N83202 Unspecified ovarian cyst, left side: Secondary | ICD-10-CM | POA: Insufficient documentation

## 2016-02-07 DIAGNOSIS — E119 Type 2 diabetes mellitus without complications: Secondary | ICD-10-CM | POA: Insufficient documentation

## 2016-02-07 DIAGNOSIS — N760 Acute vaginitis: Secondary | ICD-10-CM | POA: Insufficient documentation

## 2016-02-07 DIAGNOSIS — Z79899 Other long term (current) drug therapy: Secondary | ICD-10-CM | POA: Insufficient documentation

## 2016-02-07 DIAGNOSIS — R102 Pelvic and perineal pain: Secondary | ICD-10-CM

## 2016-02-07 LAB — CBC WITH DIFFERENTIAL/PLATELET
BASOS ABS: 0 10*3/uL (ref 0.0–0.1)
BASOS PCT: 0 %
EOS PCT: 2 %
Eosinophils Absolute: 0.2 10*3/uL (ref 0.0–0.7)
HEMATOCRIT: 43.6 % (ref 36.0–46.0)
Hemoglobin: 14.4 g/dL (ref 12.0–15.0)
LYMPHS PCT: 43 %
Lymphs Abs: 3.6 10*3/uL (ref 0.7–4.0)
MCH: 28.9 pg (ref 26.0–34.0)
MCHC: 33 g/dL (ref 30.0–36.0)
MCV: 87.6 fL (ref 78.0–100.0)
MONO ABS: 0.5 10*3/uL (ref 0.1–1.0)
Monocytes Relative: 6 %
NEUTROS ABS: 4.2 10*3/uL (ref 1.7–7.7)
Neutrophils Relative %: 49 %
PLATELETS: 204 10*3/uL (ref 150–400)
RBC: 4.98 MIL/uL (ref 3.87–5.11)
RDW: 14.1 % (ref 11.5–15.5)
WBC: 8.5 10*3/uL (ref 4.0–10.5)

## 2016-02-07 LAB — COMPREHENSIVE METABOLIC PANEL
ALBUMIN: 3.9 g/dL (ref 3.5–5.0)
ALT: 34 U/L (ref 14–54)
AST: 27 U/L (ref 15–41)
Alkaline Phosphatase: 75 U/L (ref 38–126)
Anion gap: 10 (ref 5–15)
BUN: 10 mg/dL (ref 6–20)
CHLORIDE: 104 mmol/L (ref 101–111)
CO2: 25 mmol/L (ref 22–32)
CREATININE: 0.71 mg/dL (ref 0.44–1.00)
Calcium: 9.2 mg/dL (ref 8.9–10.3)
GFR calc Af Amer: >60 mL/min (ref >60)
GFR calc non Af Amer: >60 mL/min (ref >60)
GLUCOSE: 109 mg/dL — AB (ref 65–99)
POTASSIUM: 3.3 mmol/L — AB (ref 3.5–5.1)
Sodium: 139 mmol/L (ref 135–145)
Total Bilirubin: 0.5 mg/dL (ref 0.3–1.2)
Total Protein: 7.5 g/dL (ref 6.5–8.1)

## 2016-02-07 LAB — URINALYSIS, ROUTINE W REFLEX MICROSCOPIC
Bilirubin Urine: NEGATIVE
Glucose, UA: NEGATIVE mg/dL
Hgb urine dipstick: NEGATIVE
Ketones, ur: NEGATIVE mg/dL
Leukocytes, UA: NEGATIVE
Nitrite: NEGATIVE
PROTEIN: 30 mg/dL — AB
Specific Gravity, Urine: 1.024 (ref 1.005–1.030)
pH: 5.5 (ref 5.0–8.0)

## 2016-02-07 LAB — WET PREP, GENITAL
Trich, Wet Prep: NONE SEEN
Yeast Wet Prep HPF POC: NONE SEEN

## 2016-02-07 LAB — URINE MICROSCOPIC-ADD ON

## 2016-02-07 LAB — PREGNANCY, URINE: Preg Test, Ur: NEGATIVE

## 2016-02-07 MED ORDER — KETOROLAC TROMETHAMINE 30 MG/ML IJ SOLN
30.0000 mg | Freq: Once | INTRAMUSCULAR | Status: AC
  Administered 2016-02-07: 30 mg via INTRAVENOUS
  Filled 2016-02-07: qty 1

## 2016-02-07 MED ORDER — IBUPROFEN 800 MG PO TABS: 800.0000 mg | ORAL_TABLET | Freq: Three times a day (TID) | ORAL | 0 refills | Status: DC

## 2016-02-07 MED ORDER — LIDOCAINE HCL (PF) 1 % IJ SOLN
INTRAMUSCULAR | Status: AC
  Administered 2016-02-07: 0.9 mL
  Filled 2016-02-07: qty 5

## 2016-02-07 MED ORDER — METRONIDAZOLE 500 MG PO TABS: 500.0000 mg | ORAL_TABLET | Freq: Three times a day (TID) | ORAL | 0 refills | Status: DC

## 2016-02-07 MED ORDER — AZITHROMYCIN 250 MG PO TABS
ORAL_TABLET | ORAL | Status: AC
  Administered 2016-02-07: 1000 mg
  Filled 2016-02-07: qty 4

## 2016-02-07 MED ORDER — AZITHROMYCIN 1 G PO PACK: 1.0000 g | PACK | Freq: Once | ORAL | Status: DC

## 2016-02-07 MED ORDER — SODIUM CHLORIDE 0.9 % IV BOLUS (SEPSIS)
1000.0000 mL | Freq: Once | INTRAVENOUS | Status: AC
Start: 2016-02-07 — End: 2016-02-07
  Administered 2016-02-07: 1000 mL via INTRAVENOUS

## 2016-02-07 MED ORDER — METRONIDAZOLE 500 MG PO TABS
500.0000 mg | ORAL_TABLET | Freq: Once | ORAL | Status: AC
  Administered 2016-02-07: 500 mg via ORAL
  Filled 2016-02-07: qty 1

## 2016-02-07 MED ORDER — CEFTRIAXONE SODIUM 250 MG IJ SOLR
250.0000 mg | Freq: Once | INTRAMUSCULAR | Status: AC
  Administered 2016-02-07: 250 mg via INTRAMUSCULAR
  Filled 2016-02-07: qty 250

## 2016-02-07 MED FILL — metroNIDAZOLE 500 MG TABS: 500 | 7 days supply | Qty: 21 | Fill #0

## 2016-02-07 NOTE — ED Triage Notes (Signed)
She feels like something is falling out of her vagina when she urination. Started 4 days ago.

## 2016-02-07 NOTE — ED Provider Notes (Signed)
[facility] Provider Note   CSN: [id number] Arrival date & time: 02/07/16  1130  First Provider Contact:  First MD Initiated Contact with Patient 02/07/16 1135        History   Chief Complaint Chief Complaint  Patient presents with  . Pelvic Pain    HPI Christine Callahan is a 40 y.o. female hx of Diabetes, reflux, hypertension, ovarian cyst status post partial hysterectomy and partial L oophorectomy, here presenting with pelvic pain. Patient states that several days ago, her female partner claims that the condom fell off but she wasn't sure if he actually took it off. She tried to clean her vagina but the condom never came out. She states that for the last several days, she has been urinating frequently and felt like something bulges out when she urinates. Also has beginning to have left lower quadrant pain as similar to her previous ovarian cysts. She had a partial left ovarian resection. Denies any flank pain or fever or vomiting.    The history is provided by the patient.    Past Medical History:  Diagnosis Date  . Diabetes mellitus without complication (HCC)   . Genital herpes 2013  . GERD (gastroesophageal reflux disease)   . Gonorrhea 2015  . Hepatitis C 2016  . Hypertension   . Ovarian cyst     Patient Active Problem List   Diagnosis Date Noted  . Ingrown nail 09/19/2013  . Diabetic neuropathy (HCC) 09/19/2013  . Equinus deformity of foot, acquired 09/19/2013  . Plantar keratosis, acquired 09/19/2013  . Pain in lower limb 09/19/2013  . Lumbago 09/08/2011  . Pain in joint, ankle and foot 09/08/2011  . Sciatica of left side 09/08/2011    Past Surgical History:  Procedure Laterality Date  . ABDOMINAL HYSTERECTOMY    . CHOLECYSTECTOMY      OB History    No data available       Home Medications    Prior to Admission medications   Medication Sig Start Date End Date Taking? Authorizing Provider  CLONIDINE HCL PO Take by mouth.   Yes Historical  Provider, MD  HYDROCHLOROTHIAZIDE PO Take by mouth.   Yes Historical Provider, MD  atenolol-chlorthalidone (TENORETIC) 100-25 MG per tablet Take by mouth.    Historical Provider, MD  cyclobenzaprine (FLEXERIL) 10 MG tablet Take 1 tablet (10 mg total) by mouth 3 (three) times daily as needed for muscle spasms. 11/07/15   John Molpus, MD  erythromycin ophthalmic ointment Place a 1/2 inch ribbon of ointment into the lower eyelid. Apply 4 times daily for the next 5 days. 08/25/15   Shawn C Joy, PA-C  gabapentin (NEURONTIN) 600 MG tablet Take 600 mg by mouth 2 (two) times daily.    Historical Provider, MD  HYDROcodone-acetaminophen (NORCO/VICODIN) 5-325 MG tablet Take 1-2 tablets by mouth every 6 (six) hours as needed for moderate pain. 11/07/15   John Molpus, MD  LISINOPRIL PO Take by mouth.    Historical Provider, MD  metFORMIN (GLUCOPHAGE) 500 MG tablet Take by mouth daily with breakfast.    Historical Provider, MD  metoprolol succinate (TOPROL-XL) 25 MG 24 hr tablet Take 25 mg by mouth daily.    Historical Provider, MD  predniSONE (DELTASONE) 50 MG tablet Take 1 tablet (50 mg total) by mouth daily. 09/20/15   Charlestine Night, PA-C    Family History Family History  Problem Relation Age of Onset  . Lupus Mother   . Seizures Father     Social  History Social History  Substance Use Topics  . Smoking status: Current Every Day Smoker    Packs/day: 0.50    Years: 20.00    Types: Cigarettes  . Smokeless tobacco: Never Used  . Alcohol use Yes     Comment: occasional     Allergies   Review of patient's allergies indicates no known allergies.   Review of Systems Review of Systems  Genitourinary: Positive for frequency and pelvic pain.  All other systems reviewed and are negative.    Physical Exam Updated Vital Signs BP 149/93 (BP Location: Right Arm)   Pulse 72   Temp 98.2 F (36.8 C)   Resp 20   Ht 5\' 9"  (1.753 m)   Wt 277 lb (125.6 kg)   SpO2 99%   BMI 40.91 kg/m    Physical Exam  Constitutional: She appears well-developed and well-nourished.  Uncomfortable   HENT:  Head: Normocephalic.  Eyes: EOM are normal. Pupils are equal, round, and reactive to light.  Neck: Normal range of motion. Neck supple.  Cardiovascular: Normal rate and regular rhythm.   Pulmonary/Chest: Effort normal and breath sounds normal. No respiratory distress. She has no wheezes.  Abdominal: Soft. Bowel sounds are normal.  Mild L pelvic tenderness. No CVAT   Musculoskeletal: Normal range of motion.  Neurological: She is alert.  Skin: Skin is warm.  Psychiatric: She has a normal mood and affect.  Nursing note and vitals reviewed.    ED Treatments / Results  Labs (all labs ordered are listed, but only abnormal results are displayed) Labs Reviewed  WET PREP, GENITAL - Abnormal; Notable for the following:       Result Value   Clue Cells Wet Prep HPF POC PRESENT (*)    WBC, Wet Prep HPF POC MODERATE (*)    All other components within normal limits  URINALYSIS, ROUTINE W REFLEX MICROSCOPIC (NOT AT [facility]) - Abnormal; Notable for the following:    APPearance CLOUDY (*)    Protein, ur 30 (*)    All other components within normal limits  COMPREHENSIVE METABOLIC PANEL - Abnormal; Notable for the following:    Potassium 3.3 (*)    Glucose, Bld 109 (*)    All other components within normal limits  URINE MICROSCOPIC-ADD ON - Abnormal; Notable for the following:    Squamous Epithelial / LPF 0-5 (*)    Bacteria, UA FEW (*)    Casts HYALINE CASTS (*)    All other components within normal limits  CBC WITH DIFFERENTIAL/PLATELET  PREGNANCY, URINE  GC/CHLAMYDIA PROBE AMP ([place]) NOT AT [facility]    EKG  EKG Interpretation None       Radiology US Transvaginal Non-ob  Result Date: 02/07/2016 CLINICAL DATA:  Left lower quadrant pain for 4 days EXAM: TRANSABDOMINAL AND TRANSVAGINAL ULTRASOUND OF PELVIS DOPPLER ULTRASOUND OF OVARIES TECHNIQUE: Both transabdominal and  transvaginal ultrasound examinations of the pelvis were performed. Transabdominal technique was performed for global imaging of the pelvis including uterus, ovaries, adnexal regions, and pelvic cul-de-sac. It was necessary to proceed with endovaginal exam following the transabdominal exam to visualize the ovaries. Color and duplex Doppler ultrasound was utilized to evaluate blood flow to the ovaries. COMPARISON:  CT 12/09/2014 FINDINGS: Uterus Measurements: Prior hysterectomy. Endometrium Thickness: N/A. Right ovary Measurements: Not visualized. No adnexal masses seen. Left ovary Measurements: 5.2 x 3.9 x 5.3 cm. 4.9 cm cysts noted with thin internal septation. No mural nodularity or internal blood flow. Pulsed Doppler evaluation of both ovaries demonstrates  normal low-resistance arterial and venous waveforms. Other findings Trace free fluid in the pelvis. IMPRESSION: 4.9 cm minimally complex left ovarian cyst. This could be followed with repeat ultrasound in 2-3 months. No evidence of torsion. Prior hysterectomy. Electronically Signed   By: Charlett Nose M.D.   On: 02/07/2016 12:58   US Pelvis Complete  Result Date: 02/07/2016 CLINICAL DATA:  Left lower quadrant pain for 4 days EXAM: TRANSABDOMINAL AND TRANSVAGINAL ULTRASOUND OF PELVIS DOPPLER ULTRASOUND OF OVARIES TECHNIQUE: Both transabdominal and transvaginal ultrasound examinations of the pelvis were performed. Transabdominal technique was performed for global imaging of the pelvis including uterus, ovaries, adnexal regions, and pelvic cul-de-sac. It was necessary to proceed with endovaginal exam following the transabdominal exam to visualize the ovaries. Color and duplex Doppler ultrasound was utilized to evaluate blood flow to the ovaries. COMPARISON:  CT 12/09/2014 FINDINGS: Uterus Measurements: Prior hysterectomy. Endometrium Thickness: N/A. Right ovary Measurements: Not visualized. No adnexal masses seen. Left ovary Measurements: 5.2 x 3.9 x 5.3 cm. 4.9  cm cysts noted with thin internal septation. No mural nodularity or internal blood flow. Pulsed Doppler evaluation of both ovaries demonstrates normal low-resistance arterial and venous waveforms. Other findings Trace free fluid in the pelvis. IMPRESSION: 4.9 cm minimally complex left ovarian cyst. This could be followed with repeat ultrasound in 2-3 months. No evidence of torsion. Prior hysterectomy. Electronically Signed   By: Charlett Nose M.D.   On: 02/07/2016 12:58   [place] Art/ven Flow Abd Pelv Doppler  Result Date: 02/07/2016 CLINICAL DATA:  Left lower quadrant pain for 4 days EXAM: TRANSABDOMINAL AND TRANSVAGINAL ULTRASOUND OF PELVIS DOPPLER ULTRASOUND OF OVARIES TECHNIQUE: Both transabdominal and transvaginal ultrasound examinations of the pelvis were performed. Transabdominal technique was performed for global imaging of the pelvis including uterus, ovaries, adnexal regions, and pelvic cul-de-sac. It was necessary to proceed with endovaginal exam following the transabdominal exam to visualize the ovaries. Color and duplex Doppler ultrasound was utilized to evaluate blood flow to the ovaries. COMPARISON:  CT 12/09/2014 FINDINGS: Uterus Measurements: Prior hysterectomy. Endometrium Thickness: N/A. Right ovary Measurements: Not visualized. No adnexal masses seen. Left ovary Measurements: 5.2 x 3.9 x 5.3 cm. 4.9 cm cysts noted with thin internal septation. No mural nodularity or internal blood flow. Pulsed Doppler evaluation of both ovaries demonstrates normal low-resistance arterial and venous waveforms. Other findings Trace free fluid in the pelvis. IMPRESSION: 4.9 cm minimally complex left ovarian cyst. This could be followed with repeat ultrasound in 2-3 months. No evidence of torsion. Prior hysterectomy. Electronically Signed   By: Charlett Nose M.D.   On: 02/07/2016 12:58    Procedures Procedures (including critical care time)  Medications Ordered in ED Medications  azithromycin (ZITHROMAX) powder  1 g (1 g Oral Not Given 02/07/16 1305)  sodium chloride 0.9 % bolus 1,000 mL (0 mLs Intravenous Stopped 02/07/16 1309)  ketorolac (TORADOL) 30 MG/ML injection 30 mg (30 mg Intravenous Given 02/07/16 1207)  metroNIDAZOLE (FLAGYL) tablet 500 mg (500 mg Oral Given 02/07/16 1305)  cefTRIAXone (ROCEPHIN) injection 250 mg (250 mg Intramuscular Given 02/07/16 1308)  azithromycin (ZITHROMAX) 250 MG tablet (1,000 mg  Given 02/07/16 1306)  lidocaine (PF) (XYLOCAINE) 1 % injection (0.9 mLs  Given 02/07/16 1308)     Initial Impression / Assessment and Plan / ED Course  I have reviewed the triage vital signs and the nursing notes.  Pertinent labs & imaging results that were available during my care of the patient were reviewed by me and considered in my medical decision  making (see chart for details).  Clinical Course   Darlean L Etheleen Callahan is a 40 y.o. female here with L pelvic pain, possible condom in vaginal. Afebrile, no signs of toxic shock. Hx of ovarian cyst, will get transvag US to r/o cyst vs torsion. Will get labs, UA. Will hydrate and give toradol and reassess.   12:20pm Pelvic exam performed. I saw some whitish discharge. No CMT. No obvious condoms visualized or felt on bimanual exam. There is no obvious uterine or bladder prolapse. Mild L ovarian tenderness. Will get pelvic US.   1:14 PM Wet prep + BV and there is sperm as well. Given flagyl, ceftriaxone, azithro empirically. US showed 4.6 cm cyst. Pain controlled with toradol, no signs of torsion on [place]. Will dc home with motrin, flagyl. Recommend GYN follow up and repeat US.   Final Clinical Impressions(s) / ED Diagnoses   Final diagnoses:  Pelvic pain in female    New Prescriptions New Prescriptions   No medications on file     Charlynne Pander, MD 02/07/16 1316

## 2016-02-07 NOTE — Discharge Instructions (Signed)
Take motrin for pain.   Take flagyl three times daily for a week.     See GYN doctor for follow up. You will need repeat ultrasound in several months to monitor your cyst.   Make sure your partner is using condoms   Return to ER if you have severe pelvic pain, trouble urinating, worse vaginal discharge, fevers.

## 2016-02-08 LAB — GC/CHLAMYDIA PROBE AMP (~~LOC~~) NOT AT ARMC
CHLAMYDIA, DNA PROBE: NEGATIVE
NEISSERIA GONORRHEA: NEGATIVE

## 2016-02-09 ENCOUNTER — Ambulatory Visit (INDEPENDENT_AMBULATORY_CARE_PROVIDER_SITE_OTHER): Payer: Self-pay | Admitting: Cardiology

## 2016-02-09 ENCOUNTER — Encounter: Payer: Self-pay | Admitting: Cardiology

## 2016-02-09 VITALS — BP 170/120 | HR 68 | Ht 70.0 in | Wt 284.1 lb

## 2016-02-09 DIAGNOSIS — R002 Palpitations: Secondary | ICD-10-CM

## 2016-02-09 MED ORDER — CARVEDILOL 12.5 MG PO TABS: 12.5000 mg | ORAL_TABLET | Freq: Two times a day (BID) | ORAL | 3 refills | Status: DC

## 2016-02-09 MED ORDER — LISINOPRIL 20 MG PO TABS: 20.0000 mg | ORAL_TABLET | Freq: Every day | ORAL | 3 refills | Status: DC

## 2016-02-09 MED FILL — CARVEDILOL 12.5 MG TABLET: 12.5 | 30 days supply | Qty: 60 | Fill #0

## 2016-02-09 MED FILL — LISINOPRIL 20 MG TABLET: 20 | 90 days supply | Qty: 90 | Fill #0

## 2016-02-09 NOTE — Progress Notes (Signed)
Electrophysiology Office Note   Date:  02/09/2016   ID:  DIAVION LABRADOR, DOB [birth year], MRN [id number]  PCP:  Jackie Plum, MD  Cardiologist:  Regan Lemming, MD    Chief Complaint  Patient presents with  . Advice Only  . Palpitations     History of Present Illness: Zuleyma L [name] is a 40 y.o. female who presents today for electrophysiology evaluation.   History of diabetes, hypertension, and headaches.  She says that she has been getting palpitations over the last 3 months. Palpitations last 5-10 seconds. She says that she feels like at times her heart skips a beat and at times her heart is beating more forcefully. She says that there are no exacerbating or alleviating factors. She says that she does not remember anything that happened 3 months ago that started the episodes of palpitations.  Her blood pressure is quite elevated today. She says that her primary physician has adjusted her medications, but she did not start taking her lisinopril yet. She says that she does take clonidine but only at night to help her sleep.  Today, she denies symptoms of chest pain, shortness of breath, orthopnea, PND, lower extremity edema, claudication, dizziness, presyncope, syncope, bleeding, or neurologic sequela. The patient is tolerating medications without difficulties and is otherwise without complaint today.    Past Medical History:  Diagnosis Date  . Diabetes mellitus without complication (HCC)   . Genital herpes 2013  . GERD (gastroesophageal reflux disease)   . Gonorrhea 2015  . Hepatitis C 2016  . Hypertension   . Ovarian cyst    Past Surgical History:  Procedure Laterality Date  . ABDOMINAL HYSTERECTOMY    . CHOLECYSTECTOMY       Current Outpatient Prescriptions  Medication Sig Dispense Refill  . CLONIDINE HCL PO Take by mouth.    . cyclobenzaprine (FLEXERIL) 10 MG tablet Take 1 tablet (10 mg total) by mouth 3 (three) times daily as needed for muscle spasms. 21  tablet 0  . gabapentin (NEURONTIN) 600 MG tablet Take 600 mg by mouth 2 (two) times daily.    Marland Kitchen HYDROCHLOROTHIAZIDE PO Take 25 mg by mouth.     Marland Kitchen ibuprofen (ADVIL,MOTRIN) 800 MG tablet Take 1 tablet (800 mg total) by mouth 3 (three) times daily. 30 tablet 0  . metFORMIN (GLUCOPHAGE) 500 MG tablet Take 1,000 mg by mouth 2 (two) times daily with a meal.     . metoprolol succinate (TOPROL-XL) 25 MG 24 hr tablet Take 25 mg by mouth 2 (two) times daily.     . metroNIDAZOLE (FLAGYL) 500 MG tablet Take 1 tablet (500 mg total) by mouth 3 (three) times daily. One po bid x 7 days 21 tablet 0   No current facility-administered medications for this visit.     Allergies:   Review of patient's allergies indicates no known allergies.   Social History:  The patient  reports that she has been smoking Cigarettes.  She has a 10.00 pack-year smoking history. She has never used smokeless tobacco. She reports that she drinks alcohol. She reports that she does not use drugs.   Family History:  The patient's family history includes Lupus in her mother; Seizures in her father.    ROS:  Please see the history of present illness.   Otherwise, review of systems is positive for Expected weight change, sweating, palpitations, dyspnea on exertion, snoring, diarrhea, back pain, headaches, dizziness.   All other systems are reviewed and negative.    PHYSICAL  EXAM: VS:  BP (!) 170/120   Pulse 68   Ht 5\' 10"  (1.778 m)   Wt 284 lb 1.9 oz (128.9 kg)   BMI 40.77 kg/m  , BMI Body mass index is 40.77 kg/m. GEN: Well nourished, well developed, in no acute distress  HEENT: normal  Neck: no JVD, carotid bruits, or masses Cardiac: RRR; no murmurs, rubs, or gallops,no edema  Respiratory:  clear to auscultation bilaterally, normal work of breathing GI: soft, nontender, nondistended, + BS MS: no deformity or atrophy  Skin: warm and dry Neuro:  Strength and sensation are intact Psych: euthymic mood, full affect  EKG:  EKG  is ordered today. Personal review of the ekg ordered shows sinus rhythm rate 68 nonspecific T wave abnormalities  Recent Labs: 02/07/2016: ALT 34; BUN 10; Creatinine, Ser 0.71; Hemoglobin 14.4; Platelets 204; Potassium 3.3; Sodium 139    Lipid Panel  No results found for: CHOL, TRIG, HDL, CHOLHDL, VLDL, LDLCALC, LDLDIRECT   Wt Readings from Last 3 Encounters:  02/09/16 284 lb 1.9 oz (128.9 kg)  02/07/16 277 lb (125.6 kg)  11/07/15 270 lb (122.5 kg)      Other studies Reviewed: Additional studies/ records that were reviewed today include: PCP notes   ASSESSMENT AND PLAN:  1.  Palpitations: Likely due to APCs or VPCs, but unable to exclude other causes due to arrhythmias. As she has palpitations on a daily basis, we'll fit her with a 48 hour monitor.  2. Hypertension: Elevated systolic today, but severely elevated diastolic pressures as well. She has a history of medical noncompliance and has not been taking her lisinopril, which was prescribed by her primary physician. I told her to start taking lisinopril 20 mg. We'll also change her Toprol-XL to carvedilol 12.5 mg. We'll continue her HCTZ. She does not know the dose of her clonidine, so we'll have her call her back with that dose and possibly increase to twice daily to help with blood pressure control. I have told her that if she starts to have sequela of hypertension, such as headaches or visual changes, that she should go to the emergency room to be evaluated.    Current medicines are reviewed at length with the patient today.   The patient does not have concerns regarding her medicines.  The following changes were made today:  Started lisinopril 20 mg, change metoprolol to carvedilol 12.5 mg  Labs/ tests ordered today include:  No orders of the defined types were placed in this encounter.    Disposition:   FU with Christyna Letendre 3 months  Signed, Ijeoma Loor Jorja Loa, MD  02/09/2016 3:54 PM     [facility] [address] [place] [address] [phone] (office) [phone] (fax)

## 2016-02-09 NOTE — Patient Instructions (Addendum)
Medication Instructions:  Your physician has recommended you make the following change in your medication:  1) STOP Toprol 2) START Carvedilol 12.5 mg twice daily 3) START Lisinopril 20 mg daily  Labwork: None ordered  Testing/Procedures: Your physician has recommended that you wear a holter monitor. Holter monitors are medical devices that record the heart's electrical activity. Doctors most often use these monitors to diagnose arrhythmias. Arrhythmias are problems with the speed or rhythm of the heartbeat. The monitor is a small, portable device. You can wear one while you do your normal daily activities. This is usually used to diagnose what is causing palpitations/syncope (passing out).  Office will call you to arrange this testing.  Follow-Up: Your physician recommends that you schedule a follow-up appointment in: 3 months with Dr. Elberta Fortis   Any Other Special Instructions Will Be Listed Below (If Applicable). -- please keep track of your blood pressures and call office or your primary care provider if remains elevated.  -- if you experience any visual changes/disturbances with your headache, go to the emergency room.  --  Please call the office and let us know what dose of clonidine you are taking and how often.   If you need a refill on your cardiac medications before your next appointment, please call your pharmacy.  Thank you for choosing [facility]!!   Dory Horn, RN [phone]

## 2016-02-10 ENCOUNTER — Telehealth: Payer: Self-pay | Admitting: Cardiology

## 2016-02-10 ENCOUNTER — Telehealth (HOSPITAL_BASED_OUTPATIENT_CLINIC_OR_DEPARTMENT_OTHER): Payer: Self-pay | Admitting: *Deleted

## 2016-02-10 MED ORDER — CLONIDINE HCL 0.2 MG PO TABS: 0.2000 mg | ORAL_TABLET | Freq: Two times a day (BID) | ORAL | 3 refills | Status: DC

## 2016-02-10 NOTE — Telephone Encounter (Signed)
Will forward to Dr. Elberta Fortis for his review and advisement.

## 2016-02-10 NOTE — Telephone Encounter (Signed)
Follow-up     The pt is calling concerning the question?  The pt was wondering should she take her blood pressure pills all at one time or  Space them apart. The pt is asking if the nurse could left a message on her cell phone.

## 2016-02-10 NOTE — Telephone Encounter (Signed)
Advised ok to take her medications at the same time.  She will continue to monitor her BPs.  She will call if BP drops too low or doesn't improve.

## 2016-02-10 NOTE — Telephone Encounter (Signed)
F/u ° ° ° ° ° °Pt returning nurse call.  °

## 2016-02-10 NOTE — Telephone Encounter (Signed)
Advised patient to take Clonidine BID. Patient verbalized understanding and agreeable to plan.

## 2016-02-10 NOTE — Telephone Encounter (Signed)
New message   FYI    Pt called to let you know that the prescription is for  Clionidine HDL 0.2 mg.

## 2016-02-10 NOTE — Telephone Encounter (Signed)
Lets have her take this BID.

## 2016-02-22 ENCOUNTER — Telehealth: Payer: Self-pay | Admitting: *Deleted

## 2016-02-22 NOTE — Telephone Encounter (Signed)
I spoke with [name] UE:[id number]:scheduling he monitor,[facility] Medicaid [facility] over the cost with her,I ask if she wanted to schedule her monitor, she refused.

## 2016-03-01 NOTE — Progress Notes (Signed)
02/22/16 [name], I SPOKE WITH [name] TODAY,SHE HAS MEDICAID FAM PLANNING AND THE [place],I LET HER KNOW THAT HER COST WOULD BE FHALF [name],AND SHE REFUSED.  Received: 1 week ago  Message Contents  [name]  Kollin Udell L [name], RN    Dr. Elberta Fortisamnitz made aware.  Called patient and made her aware that without this monitor we would not be able to determine palpitations.  Pt understands.

## 2016-03-02 NOTE — Telephone Encounter (Signed)
Spoke to patient yesterday to follow up on canceled holter monitor. Patient requesting Carvedilol, Lisinopril and Clonidine rx be sent [address] [facility] [place] [address] [place] Contacted pharmacy there who explained they do not fill outside rx.  Advised to send to pt's primary in that building - and they would then write rx for pt to have filled there. Faxed rx for all 3 medications with 6 refills.  Faxed to [phone].

## 2016-03-19 ENCOUNTER — Emergency Department (HOSPITAL_BASED_OUTPATIENT_CLINIC_OR_DEPARTMENT_OTHER): Payer: Medicaid Other

## 2016-03-19 ENCOUNTER — Encounter (HOSPITAL_BASED_OUTPATIENT_CLINIC_OR_DEPARTMENT_OTHER): Payer: Self-pay | Admitting: Emergency Medicine

## 2016-03-19 DIAGNOSIS — E119 Type 2 diabetes mellitus without complications: Secondary | ICD-10-CM | POA: Insufficient documentation

## 2016-03-19 DIAGNOSIS — F17210 Nicotine dependence, cigarettes, uncomplicated: Secondary | ICD-10-CM | POA: Insufficient documentation

## 2016-03-19 DIAGNOSIS — Z7984 Long term (current) use of oral hypoglycemic drugs: Secondary | ICD-10-CM | POA: Insufficient documentation

## 2016-03-19 DIAGNOSIS — Z79899 Other long term (current) drug therapy: Secondary | ICD-10-CM | POA: Insufficient documentation

## 2016-03-19 DIAGNOSIS — I10 Essential (primary) hypertension: Secondary | ICD-10-CM | POA: Insufficient documentation

## 2016-03-19 DIAGNOSIS — R0789 Other chest pain: Secondary | ICD-10-CM | POA: Insufficient documentation

## 2016-03-19 DIAGNOSIS — E876 Hypokalemia: Secondary | ICD-10-CM | POA: Insufficient documentation

## 2016-03-19 NOTE — ED Triage Notes (Signed)
Patient reports chest pain for months but states this got worse today around 1100.  Reports shortness of breath, headache.  Reports nausea/vomiting.

## 2016-03-19 NOTE — ED Triage Notes (Addendum)
Patient requesting urine to be checked for possible UTI.  Reports dysuria, nausea.  Denies hematuria.

## 2016-03-20 ENCOUNTER — Emergency Department (HOSPITAL_BASED_OUTPATIENT_CLINIC_OR_DEPARTMENT_OTHER)
Admission: EM | Admit: 2016-03-20 | Discharge: 2016-03-20 | Disposition: A | Discharge status: Home / Self Care | Payer: Medicaid Other | Attending: Emergency Medicine | Admitting: Emergency Medicine

## 2016-03-20 DIAGNOSIS — E876 Hypokalemia: Secondary | ICD-10-CM

## 2016-03-20 DIAGNOSIS — R0789 Other chest pain: Secondary | ICD-10-CM

## 2016-03-20 LAB — URINALYSIS, ROUTINE W REFLEX MICROSCOPIC
Bilirubin Urine: NEGATIVE
GLUCOSE, UA: NEGATIVE mg/dL
Hgb urine dipstick: NEGATIVE
Ketones, ur: NEGATIVE mg/dL
LEUKOCYTES UA: NEGATIVE
Nitrite: NEGATIVE
PROTEIN: NEGATIVE mg/dL
SPECIFIC GRAVITY, URINE: 1.012 (ref 1.005–1.030)
pH: 6 (ref 5.0–8.0)

## 2016-03-20 LAB — BASIC METABOLIC PANEL
ANION GAP: 8 (ref 5–15)
BUN: 14 mg/dL (ref 6–20)
CALCIUM: 9.3 mg/dL (ref 8.9–10.3)
CO2: 26 mmol/L (ref 22–32)
Chloride: 105 mmol/L (ref 101–111)
Creatinine, Ser: 0.89 mg/dL (ref 0.44–1.00)
GFR calc non Af Amer: >60 mL/min (ref >60)
GLUCOSE: 123 mg/dL — AB (ref 65–99)
POTASSIUM: 3 mmol/L — AB (ref 3.5–5.1)
Sodium: 139 mmol/L (ref 135–145)

## 2016-03-20 LAB — CBC
HEMATOCRIT: 40.6 % (ref 36.0–46.0)
HEMOGLOBIN: 13.7 g/dL (ref 12.0–15.0)
MCH: 29.5 pg (ref 26.0–34.0)
MCHC: 33.7 g/dL (ref 30.0–36.0)
MCV: 87.3 fL (ref 78.0–100.0)
Platelets: 220 10*3/uL (ref 150–400)
RBC: 4.65 MIL/uL (ref 3.87–5.11)
RDW: 13.8 % (ref 11.5–15.5)
WBC: 9 10*3/uL (ref 4.0–10.5)

## 2016-03-20 LAB — TROPONIN I

## 2016-03-20 MED ORDER — KETOROLAC TROMETHAMINE 15 MG/ML IJ SOLN
15.0000 mg | Freq: Once | INTRAMUSCULAR | Status: AC
  Administered 2016-03-20: 15 mg via INTRAVENOUS
  Filled 2016-03-20: qty 1

## 2016-03-20 MED ORDER — FENTANYL CITRATE (PF) 100 MCG/2ML IJ SOLN
50.0000 ug | Freq: Once | INTRAMUSCULAR | Status: AC
  Administered 2016-03-20: 50 ug via INTRAVENOUS
  Filled 2016-03-20: qty 2

## 2016-03-20 MED ORDER — HYDROCODONE-ACETAMINOPHEN 5-325 MG PO TABS: 1.0000 | ORAL_TABLET | Freq: Four times a day (QID) | ORAL | 0 refills | Status: DC | PRN

## 2016-03-20 MED ORDER — POTASSIUM CHLORIDE CRYS ER 20 MEQ PO TBCR: 20.0000 meq | EXTENDED_RELEASE_TABLET | Freq: Every day | ORAL | 0 refills | Status: DC

## 2016-03-20 MED ORDER — POTASSIUM CHLORIDE CRYS ER 20 MEQ PO TBCR
40.0000 meq | EXTENDED_RELEASE_TABLET | Freq: Once | ORAL | Status: AC
  Administered 2016-03-20: 40 meq via ORAL
  Filled 2016-03-20: qty 2

## 2016-03-20 NOTE — ED Provider Notes (Signed)
[facility] Provider Note: Lowella Dell, MD, FACEP  CSN: [id number] MRN: [id number] ARRIVAL: 03/19/16 at 2343   CHIEF COMPLAINT  Chest Pain   HISTORY OF PRESENT ILLNESS  Christine Callahan is a 40 y.o. female with a long-standing history of chest pain which is worsened over the past week. She fell about a week ago under her left side and patient had injured her chest. Her pain is located in her lower sternal region and is worse with movement, palpation or deep breathing. The pain is causing her to have mild shortness of breath. She rates her pain is moderate to severe and is sharp in nature. She denies fever, chills, vomiting or diarrhea. She has had some transient nausea. She is also complaining of burning with urination. She has been taking ibuprofen without adequate relief of her chest pain.   Past Medical History:  Diagnosis Date  . Diabetes mellitus without complication (HCC)   . Genital herpes 2013  . GERD (gastroesophageal reflux disease)   . Gonorrhea 2015  . Hepatitis C 2016  . Hypertension   . Ovarian cyst     Past Surgical History:  Procedure Laterality Date  . ABDOMINAL HYSTERECTOMY    . CHOLECYSTECTOMY      Family History  Problem Relation Age of Onset  . Lupus Mother   . Seizures Father     Social History  Substance Use Topics  . Smoking status: Current Every Day Smoker    Packs/day: 0.50    Years: 20.00    Types: Cigarettes  . Smokeless tobacco: Never Used  . Alcohol use Yes     Comment: occasional    Prior to Admission medications   Medication Sig Start Date End Date Taking? Authorizing Provider  carvedilol (COREG) 12.5 MG tablet Take 1 tablet (12.5 mg total) by mouth 2 (two) times daily. 02/09/16   Will Jorja Loa, MD  cloNIDine (CATAPRES) 0.2 MG tablet Take 1 tablet (0.2 mg total) by mouth 2 (two) times daily. 02/10/16   Will Jorja Loa, MD  cyclobenzaprine (FLEXERIL) 10 MG tablet Take 1 tablet (10 mg total) by mouth 3 (three)  times daily as needed for muscle spasms. 11/07/15   Jedrick Hutcherson, MD  gabapentin (NEURONTIN) 600 MG tablet Take 600 mg by mouth 2 (two) times daily.    Historical Provider, MD  HYDROCHLOROTHIAZIDE PO Take 25 mg by mouth.     Historical Provider, MD  HYDROcodone-acetaminophen (NORCO) 5-325 MG tablet Take 1-2 tablets by mouth every 6 (six) hours as needed (for pain). 03/20/16   Darric Plante, MD  ibuprofen (ADVIL,MOTRIN) 800 MG tablet Take 1 tablet (800 mg total) by mouth 3 (three) times daily. 02/07/16   Charlynne Pander, MD  lisinopril (PRINIVIL,ZESTRIL) 20 MG tablet Take 1 tablet (20 mg total) by mouth daily. 02/09/16 05/09/16  Will Jorja Loa, MD  metFORMIN (GLUCOPHAGE) 500 MG tablet Take 1,000 mg by mouth 2 (two) times daily with a meal.     Historical Provider, MD  metroNIDAZOLE (FLAGYL) 500 MG tablet Take 1 tablet (500 mg total) by mouth 3 (three) times daily. One po bid x 7 days 02/07/16   Charlynne Pander, MD  potassium chloride SA (K-DUR,KLOR-CON) 20 MEQ tablet Take 1 tablet (20 mEq total) by mouth daily. 03/20/16   Paula Libra, MD    Allergies Review of patient's allergies indicates no known allergies.   REVIEW OF SYSTEMS  Negative except as noted here or in the History of Present Illness.  PHYSICAL EXAMINATION  Initial Vital Signs Blood pressure (!) 161/108, pulse 81, temperature 98.1 F (36.7 C), temperature source Oral, resp. rate 18, height 5\' 3"  (1.6 m), weight 280 lb (127 kg), SpO2 97 %.  Examination General: Well-develop, obese female in no acute distress; appearance consistent with age of record HENT: normocephalic; atraumatic Eyes: pupils equal, round and reactive to light; extraocular muscles intact Neck: supple Heart: regular rate and rhythm; no murmurs, rubs or gallops Lungs: clear to auscultation bilaterally Chest: Lower sternal and left parasternal tenderness Abdomen: soft; obese; nontender; bowel sounds present Extremities: No deformity; full range of motion;  pulses normal Neurologic: Awake, alert and oriented; motor function intact in all extremities and symmetric; no facial droop Skin: Warm and dry Psychiatric: Normal mood and affect   RESULTS  Summary of this visit's results, reviewed by myself:   EKG Interpretation  Date/Time:  Sunday March 19 2016 23:51:56 EDT Ventricular Rate:  75 PR Interval:  172 QRS Duration: 88 QT Interval:  380 QTC Calculation: 424 R Axis:   62 Text Interpretation:  Normal sinus rhythm ST & T wave abnormality, consider inferolateral ischemia Abnormal ECG No significant change was found Confirmed by Alaia Lordi  MD, Jonny Ruiz ([id number]) on 03/19/2016 11:59:38 PM      Laboratory Studies: Results for orders placed or performed during the hospital encounter of 03/20/16 (from the past 24 hour(s))  Urinalysis, Routine w reflex microscopic (not at [facility])     Status: None   Collection Time: 03/19/16 11:47 PM  Result Value Ref Range   Color, Urine YELLOW YELLOW   APPearance CLEAR CLEAR   Specific Gravity, Urine 1.012 1.005 - 1.030   pH 6.0 5.0 - 8.0   Glucose, UA NEGATIVE NEGATIVE mg/dL   Hgb urine dipstick NEGATIVE NEGATIVE   Bilirubin Urine NEGATIVE NEGATIVE   Ketones, ur NEGATIVE NEGATIVE mg/dL   Protein, ur NEGATIVE NEGATIVE mg/dL   Nitrite NEGATIVE NEGATIVE   Leukocytes, UA NEGATIVE NEGATIVE  Basic metabolic panel     Status: Abnormal   Collection Time: 03/20/16  1:03 AM  Result Value Ref Range   Sodium 139 135 - 145 mmol/L   Potassium 3.0 (L) 3.5 - 5.1 mmol/L   Chloride 105 101 - 111 mmol/L   CO2 26 22 - 32 mmol/L   Glucose, Bld 123 (H) 65 - 99 mg/dL   BUN 14 6 - 20 mg/dL   Creatinine, Ser 6.04 0.44 - 1.00 mg/dL   Calcium 9.3 8.9 - 54.0 mg/dL   GFR calc non Af Amer >60 >60 mL/min   GFR calc Af Amer >60 >60 mL/min   Anion gap 8 5 - 15  CBC     Status: None   Collection Time: 03/20/16  1:03 AM  Result Value Ref Range   WBC 9.0 4.0 - 10.5 K/uL   RBC 4.65 3.87 - 5.11 MIL/uL   Hemoglobin 13.7 12.0 - 15.0  g/dL   HCT 98.1 19.1 - 47.8 %   MCV 87.3 78.0 - 100.0 fL   MCH 29.5 26.0 - 34.0 pg   MCHC 33.7 30.0 - 36.0 g/dL   RDW 29.5 62.1 - 30.8 %   Platelets 220 150 - 400 K/uL  Troponin I     Status: None   Collection Time: 03/20/16  1:03 AM  Result Value Ref Range   Troponin I <0.03 <0.03 ng/mL   Imaging Studies: Dg Chest 2 View  Result Date: 03/20/2016 CLINICAL DATA:  Chest pain for 5 days. EXAM: CHEST  2  VIEW COMPARISON:  Radiograph 12/27/2015.  CT 09/15/2015 FINDINGS: The cardiomediastinal contours are normal. The lungs are clear. Pulmonary vasculature is normal. No consolidation, pleural effusion, or pneumothorax. No acute osseous abnormalities are seen. Prominent bilateral first rib costovertebral proliferation, unchanged. IMPRESSION: No active cardiopulmonary disease. Electronically Signed   By: Rubye OaksMelanie  Ehinger M.D.   On: 03/20/2016 01:07    ED COURSE  Nursing notes and initial vitals signs, including pulse oximetry, reviewed.   PROCEDURES    ED DIAGNOSES     ICD-9-CM ICD-10-CM   1. Chest wall pain 786.52 R07.89   2. Hypokalemia 276.8 E87.6        Paula LibraJohn Landra Howze, MD 03/20/16 [phone]

## 2016-03-22 ENCOUNTER — Ambulatory Visit: Payer: Medicaid Other | Admitting: Cardiology

## 2016-03-24 MED FILL — POTASSIUM CL ER 20 MEQ TABL: 20 | 7 days supply | Qty: 7 | Fill #0

## 2016-03-24 MED FILL — HYDROCODON-APAP 5-325: 5-325 | 2 days supply | Qty: 10 | Fill #0

## 2016-04-16 ENCOUNTER — Emergency Department (HOSPITAL_BASED_OUTPATIENT_CLINIC_OR_DEPARTMENT_OTHER)
Admission: EM | Admit: 2016-04-16 | Discharge: 2016-04-17 | Disposition: A | Discharge status: Home / Self Care | Payer: Self-pay | Attending: Emergency Medicine | Admitting: Emergency Medicine

## 2016-04-16 ENCOUNTER — Encounter (HOSPITAL_BASED_OUTPATIENT_CLINIC_OR_DEPARTMENT_OTHER): Payer: Self-pay | Admitting: Emergency Medicine

## 2016-04-16 ENCOUNTER — Emergency Department (HOSPITAL_BASED_OUTPATIENT_CLINIC_OR_DEPARTMENT_OTHER): Payer: Self-pay

## 2016-04-16 DIAGNOSIS — E119 Type 2 diabetes mellitus without complications: Secondary | ICD-10-CM | POA: Insufficient documentation

## 2016-04-16 DIAGNOSIS — R350 Frequency of micturition: Secondary | ICD-10-CM | POA: Insufficient documentation

## 2016-04-16 DIAGNOSIS — Z79899 Other long term (current) drug therapy: Secondary | ICD-10-CM | POA: Insufficient documentation

## 2016-04-16 DIAGNOSIS — F17210 Nicotine dependence, cigarettes, uncomplicated: Secondary | ICD-10-CM | POA: Insufficient documentation

## 2016-04-16 DIAGNOSIS — I10 Essential (primary) hypertension: Secondary | ICD-10-CM | POA: Insufficient documentation

## 2016-04-16 DIAGNOSIS — Z7984 Long term (current) use of oral hypoglycemic drugs: Secondary | ICD-10-CM | POA: Insufficient documentation

## 2016-04-16 DIAGNOSIS — R1032 Left lower quadrant pain: Secondary | ICD-10-CM | POA: Insufficient documentation

## 2016-04-16 LAB — CBC WITH DIFFERENTIAL/PLATELET
Basophils Absolute: 0 10*3/uL (ref 0.0–0.1)
Basophils Relative: 0 %
EOS PCT: 3 %
Eosinophils Absolute: 0.2 10*3/uL (ref 0.0–0.7)
HEMATOCRIT: 42.3 % (ref 36.0–46.0)
Hemoglobin: 13.9 g/dL (ref 12.0–15.0)
LYMPHS PCT: 44 %
Lymphs Abs: 3.5 10*3/uL (ref 0.7–4.0)
MCH: 29 pg (ref 26.0–34.0)
MCHC: 32.9 g/dL (ref 30.0–36.0)
MCV: 88.3 fL (ref 78.0–100.0)
MONO ABS: 0.5 10*3/uL (ref 0.1–1.0)
MONOS PCT: 6 %
NEUTROS ABS: 3.7 10*3/uL (ref 1.7–7.7)
Neutrophils Relative %: 47 %
PLATELETS: 209 10*3/uL (ref 150–400)
RBC: 4.79 MIL/uL (ref 3.87–5.11)
RDW: 14 % (ref 11.5–15.5)
WBC: 7.9 10*3/uL (ref 4.0–10.5)

## 2016-04-16 LAB — COMPREHENSIVE METABOLIC PANEL
ALK PHOS: 85 U/L (ref 38–126)
ALT: 40 U/L (ref 14–54)
ANION GAP: 8 (ref 5–15)
AST: 37 U/L (ref 15–41)
Albumin: 3.8 g/dL (ref 3.5–5.0)
BILIRUBIN TOTAL: 0.3 mg/dL (ref 0.3–1.2)
BUN: 13 mg/dL (ref 6–20)
CALCIUM: 9.7 mg/dL (ref 8.9–10.3)
CO2: 27 mmol/L (ref 22–32)
Chloride: 105 mmol/L (ref 101–111)
Creatinine, Ser: 0.99 mg/dL (ref 0.44–1.00)
GFR calc Af Amer: >60 mL/min (ref >60)
GFR calc non Af Amer: >60 mL/min (ref >60)
GLUCOSE: 175 mg/dL — AB (ref 65–99)
POTASSIUM: 3.2 mmol/L — AB (ref 3.5–5.1)
Sodium: 140 mmol/L (ref 135–145)
TOTAL PROTEIN: 7.6 g/dL (ref 6.5–8.1)

## 2016-04-16 LAB — LIPASE, BLOOD: Lipase: 33 U/L (ref 11–51)

## 2016-04-16 LAB — URINALYSIS, ROUTINE W REFLEX MICROSCOPIC
BILIRUBIN URINE: NEGATIVE
GLUCOSE, UA: 100 mg/dL — AB
HGB URINE DIPSTICK: NEGATIVE
KETONES UR: NEGATIVE mg/dL
Leukocytes, UA: NEGATIVE
Nitrite: NEGATIVE
PH: 6 (ref 5.0–8.0)
Protein, ur: NEGATIVE mg/dL
SPECIFIC GRAVITY, URINE: 1.019 (ref 1.005–1.030)

## 2016-04-16 MED ORDER — MORPHINE SULFATE (PF) 4 MG/ML IV SOLN
4.0000 mg | Freq: Once | INTRAVENOUS | Status: AC
  Administered 2016-04-16: 4 mg via INTRAVENOUS
  Filled 2016-04-16: qty 1

## 2016-04-16 NOTE — ED Provider Notes (Signed)
[facility] Provider Note   CSN: [id number] Arrival date & time: 04/16/16  2236   By signing my name below, I, Teofilo Pod, attest that this documentation has been prepared under the direction and in the presence of Dione Booze, MD . Electronically Signed: Teofilo Pod, ED Scribe. 04/16/2016. 11:10 PM.   History   Chief Complaint Chief Complaint  Patient presents with  . Abdominal Pain    The history is provided by the patient. No language interpreter was used.   HPI Comments:  Christine Callahan is a 40 y.o. female with PMHx of ovarian cyst and DM who presents to the Emergency Department complaining of constant LLQ abdominal pain x3 days. Pt states that the pain radiates to her back slightly, and rates the pain at 8/10. Pt complains of associated urinary frequency. Pt states that the pain is exacerbated by laying down. Pt reports PSHx of hysterectomy. Pt has used heat and motrin with no relief. Pt denies nausea, fever, chills, diaphoresis, urinary symptoms.    Past Medical History:  Diagnosis Date  . Diabetes mellitus without complication (HCC)   . Genital herpes 2013  . GERD (gastroesophageal reflux disease)   . Gonorrhea 2015  . Hepatitis C 2016  . Hypertension   . Ovarian cyst     Patient Active Problem List   Diagnosis Date Noted  . Ingrown nail 09/19/2013  . Diabetic neuropathy (HCC) 09/19/2013  . Equinus deformity of foot, acquired 09/19/2013  . Plantar keratosis, acquired 09/19/2013  . Pain in lower limb 09/19/2013  . Lumbago 09/08/2011  . Pain in joint, ankle and foot 09/08/2011  . Sciatica of left side 09/08/2011    Past Surgical History:  Procedure Laterality Date  . ABDOMINAL HYSTERECTOMY    . CHOLECYSTECTOMY      OB History    No data available       Home Medications    Prior to Admission medications   Medication Sig Start Date End Date Taking? Authorizing Provider  carvedilol (COREG) 12.5 MG tablet Take 1 tablet (12.5  mg total) by mouth 2 (two) times daily. 02/09/16   Will Jorja Loa, MD  cloNIDine (CATAPRES) 0.2 MG tablet Take 1 tablet (0.2 mg total) by mouth 2 (two) times daily. 02/10/16   Will Jorja Loa, MD  cyclobenzaprine (FLEXERIL) 10 MG tablet Take 1 tablet (10 mg total) by mouth 3 (three) times daily as needed for muscle spasms. 11/07/15   John Molpus, MD  gabapentin (NEURONTIN) 600 MG tablet Take 600 mg by mouth 2 (two) times daily.    Historical Provider, MD  HYDROCHLOROTHIAZIDE PO Take 25 mg by mouth.     Historical Provider, MD  HYDROcodone-acetaminophen (NORCO) 5-325 MG tablet Take 1-2 tablets by mouth every 6 (six) hours as needed (for pain). 03/20/16   John Molpus, MD  ibuprofen (ADVIL,MOTRIN) 800 MG tablet Take 1 tablet (800 mg total) by mouth 3 (three) times daily. 02/07/16   Charlynne Pander, MD  lisinopril (PRINIVIL,ZESTRIL) 20 MG tablet Take 1 tablet (20 mg total) by mouth daily. 02/09/16 05/09/16  Will Jorja Loa, MD  metFORMIN (GLUCOPHAGE) 500 MG tablet Take 1,000 mg by mouth 2 (two) times daily with a meal.     Historical Provider, MD  metroNIDAZOLE (FLAGYL) 500 MG tablet Take 1 tablet (500 mg total) by mouth 3 (three) times daily. One po bid x 7 days 02/07/16   Charlynne Pander, MD  potassium chloride SA (K-DUR,KLOR-CON) 20 MEQ tablet Take 1 tablet (  20 mEq total) by mouth daily. 03/20/16   Paula Libra, MD    Family History Family History  Problem Relation Age of Onset  . Lupus Mother   . Seizures Father     Social History Social History  Substance Use Topics  . Smoking status: Current Every Day Smoker    Packs/day: 0.50    Years: 20.00    Types: Cigarettes  . Smokeless tobacco: Never Used  . Alcohol use Yes     Comment: occasional     Allergies   Review of patient's allergies indicates no known allergies.   Review of Systems Review of Systems  Constitutional: Negative for chills, diaphoresis and fever.  Gastrointestinal: Positive for abdominal pain. Negative for  nausea.  Genitourinary: Positive for frequency. Negative for dysuria.  All other systems reviewed and are negative.    Physical Exam Updated Vital Signs BP (!) 169/105   Pulse 81   Temp 98.3 F (36.8 C)   Resp 18   Ht 5\' 3"  (1.6 m)   Wt 280 lb (127 kg)   SpO2 98%   BMI 49.60 kg/m   Physical Exam  Constitutional: She is oriented to person, place, and time. She appears well-developed and well-nourished.  HENT:  Head: Normocephalic and atraumatic.  Eyes: EOM are normal. Pupils are equal, round, and reactive to light.  Neck: Normal range of motion. Neck supple. No JVD present.  Cardiovascular: Normal rate, regular rhythm and normal heart sounds.   No murmur heard. Pulmonary/Chest: Effort normal and breath sounds normal. She has no wheezes. She has no rales. She exhibits no tenderness.  Abdominal: Soft. Bowel sounds are normal. She exhibits no distension and no mass. There is no tenderness. There is no rebound and no guarding.  Moderate LLQ tenderness  Musculoskeletal: Normal range of motion. She exhibits no edema.  Lymphadenopathy:    She has no cervical adenopathy.  Neurological: She is alert and oriented to person, place, and time. No cranial nerve deficit. She exhibits normal muscle tone. Coordination normal.  Skin: Skin is warm and dry. No rash noted.  Psychiatric: She has a normal mood and affect. Her behavior is normal. Judgment and thought content normal.  Nursing note and vitals reviewed.    ED Treatments / Results  DIAGNOSTIC STUDIES:  Oxygen Saturation is 98% on RA, normal by my interpretation.    COORDINATION OF CARE:  11:10 PM Discussed treatment plan with pt at bedside and pt agreed to plan.   Labs (all labs ordered are listed, but only abnormal results are displayed) Labs Reviewed  COMPREHENSIVE METABOLIC PANEL - Abnormal; Notable for the following:       Result Value   Potassium 3.2 (*)    Glucose, Bld 175 (*)    All other components within normal  limits  URINALYSIS, ROUTINE W REFLEX MICROSCOPIC (NOT AT [facility]) - Abnormal; Notable for the following:    Glucose, UA 100 (*)    All other components within normal limits  CBC WITH DIFFERENTIAL/PLATELET  LIPASE, BLOOD     Radiology Ct Abdomen Pelvis W Contrast  Result Date: 04/17/2016 CLINICAL DATA:  Left lower quadrant abdominal pain for 3 days. Urinary frequency. EXAM: CT ABDOMEN AND PELVIS WITH CONTRAST TECHNIQUE: Multidetector CT imaging of the abdomen and pelvis was performed using the standard protocol following bolus administration of intravenous contrast. CONTRAST:  ISOVUE-300 IOPAMIDOL (ISOVUE-300) INJECTION 61% COMPARISON:  Pelvic ultrasound 02/07/2016. CT abdomen/pelvis 11/02/2014, CT angiography 12/09/2014 FINDINGS: Lower chest: No acute abnormality. Hepatobiliary: Hepatic  steatosis. Left lobe of the liver is prominent. No focal lesion. Postcholecystectomy. Biliary prominence is unchanged from prior exam and likely reservoir effect secondary to prior cholecystectomy. Pancreas: No ductal dilatation or inflammation. Spleen: Normal in size without focal abnormality. Splenule inferiorly. Adrenals/Urinary Tract: Adrenal glands are unremarkable. Kidneys are normal, without renal calculi, focal lesion, or hydronephrosis. Bladder is unremarkable. Stomach/Bowel: Stomach is within normal limits. Appendix appears normal. No evidence of bowel wall thickening, distention, or inflammatory changes. No significant diverticular changes. Vascular/Lymphatic: No significant vascular findings are present. No enlarged abdominal or pelvic lymph nodes. Small central mesenteric and gastrohepatic nodes are not enlarged by size criteria and unchanged from prior. Reproductive: Post hysterectomy. Complex left ovarian cyst on prior ultrasound is not confidently identified. No adnexal mass seen by CT. Other: No free air, free fluid, or intra-abdominal fluid collection. Tiny fat containing umbilical hernia.  Musculoskeletal: There are no acute or suspicious osseous abnormalities. Degenerative change in the spine and both hips. IMPRESSION: 1. No acute abnormality in the abdomen/pelvis. 2. Hepatic steatosis. Electronically Signed   By: Rubye Oaks M.D.   On: 04/17/2016 01:51    Procedures Procedures (including critical care time)  Medications Ordered in ED Medications  ciprofloxacin (CIPRO) tablet 500 mg (not administered)  metroNIDAZOLE (FLAGYL) tablet 500 mg (not administered)  potassium chloride SA (K-DUR,KLOR-CON) CR tablet 40 mEq (not administered)  morphine 4 MG/ML injection 4 mg (4 mg Intravenous Given 04/16/16 2349)  morphine 4 MG/ML injection 4 mg (4 mg Intravenous Given 04/17/16 0034)  iopamidol (ISOVUE-300) 61 % injection 100 mL (100 mLs Intravenous Contrast Given 04/17/16 0046)     Initial Impression / Assessment and Plan / ED Course  I have reviewed the triage vital signs and the nursing notes.  Pertinent labs & imaging results that were available during my care of the patient were reviewed by me and considered in my medical decision making (see chart for details).  Clinical Course   Left lower quadrant pain of uncertain cause. Old records are reviewed, and she had been evaluated for similar pain on July 31. Ultrasound at that time did show a 4.9 cm left ovarian cyst and she was treated with antibiotics. At this point, I am concerned that she may have diverticulitis and may have had a partial response to the antibody etc. used to treat possible PID. Will send for CT of abdomen and pelvis. Although she does have some symptoms suggestive of UTI, urinalysis is normal except for glucosuria.  Urinalysis shows no evidence of urinary tract infection. CT scan is read is negative but does appear to show significant sigmoid diverticulosis on my reading. No evidence of significant ovarian cyst. I still suspect that her pain is from diverticulitis and will give a trial of ciprofloxacin and  metronidazole. She is discharged with prescription for same as well as a prescription for tramadol for pain and she is to follow-up with her PCP.  Final Clinical Impressions(s) / ED Diagnoses   Final diagnoses:  Left lower quadrant pain    New Prescriptions New Prescriptions   CIPROFLOXACIN (CIPRO) 500 MG TABLET    Take 1 tablet (500 mg total) by mouth 2 (two) times daily.   METRONIDAZOLE (FLAGYL) 500 MG TABLET    Take 1 tablet (500 mg total) by mouth 3 (three) times daily.   TRAMADOL (ULTRAM) 50 MG TABLET    Take 1 tablet (50 mg total) by mouth every 6 (six) hours as needed.  I personally performed the services described in this  documentation, which was scribed in my presence. The recorded information has been reviewed and is accurate.      Dione Boozeavid Davionte Lusby, MD 04/17/16 [phone]

## 2016-04-16 NOTE — ED Notes (Signed)
MD at bedside. 

## 2016-04-16 NOTE — ED Triage Notes (Signed)
Pt in c/o LLQ pain onset 3 days ago. Denies n/v/d or issues with bowel or bladder, is requesting urine check for UTI. Pt is alert, interactive, ambulatory in NAD.

## 2016-04-16 NOTE — ED Notes (Signed)
Pt placed on continuous pulse ox. Continuing to drink contrast.

## 2016-04-17 MED ORDER — TRAMADOL HCL 50 MG PO TABS: 50.0000 mg | ORAL_TABLET | Freq: Four times a day (QID) | ORAL | 0 refills | Status: DC | PRN

## 2016-04-17 MED ORDER — IOPAMIDOL (ISOVUE-300) INJECTION 61%
100.0000 mL | Freq: Once | INTRAVENOUS | Status: AC | PRN
  Administered 2016-04-17: 100 mL via INTRAVENOUS

## 2016-04-17 MED ORDER — CIPROFLOXACIN HCL 500 MG PO TABS: 500.0000 mg | ORAL_TABLET | Freq: Two times a day (BID) | ORAL | 0 refills | Status: DC

## 2016-04-17 MED ORDER — METRONIDAZOLE 500 MG PO TABS
500.0000 mg | ORAL_TABLET | Freq: Once | ORAL | Status: AC
Start: 2016-04-17 — End: 2016-04-17
  Administered 2016-04-17: 500 mg via ORAL
  Filled 2016-04-17: qty 1

## 2016-04-17 MED ORDER — METRONIDAZOLE 500 MG PO TABS: 500.0000 mg | ORAL_TABLET | Freq: Three times a day (TID) | ORAL | 0 refills | Status: DC

## 2016-04-17 MED ORDER — CIPROFLOXACIN HCL 500 MG PO TABS
500.0000 mg | ORAL_TABLET | Freq: Once | ORAL | Status: AC
  Administered 2016-04-17: 500 mg via ORAL
  Filled 2016-04-17: qty 1

## 2016-04-17 MED ORDER — MORPHINE SULFATE (PF) 4 MG/ML IV SOLN
4.0000 mg | Freq: Once | INTRAVENOUS | Status: AC
  Administered 2016-04-17: 4 mg via INTRAVENOUS
  Filled 2016-04-17: qty 1

## 2016-04-17 MED ORDER — POTASSIUM CHLORIDE CRYS ER 20 MEQ PO TBCR
40.0000 meq | EXTENDED_RELEASE_TABLET | Freq: Once | ORAL | Status: AC
  Administered 2016-04-17: 40 meq via ORAL
  Filled 2016-04-17: qty 2

## 2016-04-17 NOTE — ED Notes (Signed)
Pt given d/c instructions as per chart. Rx x 3. Verbalizes understanding. No questions. 

## 2016-04-17 NOTE — Discharge Instructions (Signed)
Return if pain is getting worse, you start running a high fever, or if you start vomiting.

## 2016-04-17 NOTE — ED Notes (Signed)
Patient transported to CT 

## 2016-04-17 NOTE — ED Notes (Signed)
Pt playing video games on her phone.

## 2016-04-17 NOTE — ED Notes (Signed)
MD at bedside to discuss results.

## 2016-04-21 MED FILL — cloNIDine HCL 0.2 MG TABS: 0.2 | 30 days supply | Qty: 60 | Fill #0

## 2016-04-21 MED FILL — HYDROCHLOROTHIAZIDE 25 MG T: 25 | 30 days supply | Qty: 30 | Fill #0

## 2016-04-24 MED FILL — traMADol HCL 50 MG TABS: 50 | 4 days supply | Qty: 15 | Fill #0

## 2016-04-24 MED FILL — CIPROFLOXACIN HCL 500 MG TA: 500 | 10 days supply | Qty: 20 | Fill #0

## 2016-05-17 MED FILL — CARVEDILOL 12.5 MG TABLET: 12.5 | 30 days supply | Qty: 60 | Fill #1

## 2016-05-17 MED FILL — LISINOPRIL 20 MG TABLET: 20 | 90 days supply | Qty: 90 | Fill #1

## 2016-05-20 ENCOUNTER — Emergency Department (HOSPITAL_BASED_OUTPATIENT_CLINIC_OR_DEPARTMENT_OTHER)
Admission: EM | Admit: 2016-05-20 | Discharge: 2016-05-20 | Disposition: A | Discharge status: Home / Self Care | Payer: Medicaid Other | Attending: Emergency Medicine | Admitting: Emergency Medicine

## 2016-05-20 ENCOUNTER — Encounter (HOSPITAL_BASED_OUTPATIENT_CLINIC_OR_DEPARTMENT_OTHER): Payer: Self-pay | Admitting: Emergency Medicine

## 2016-05-20 DIAGNOSIS — Z7984 Long term (current) use of oral hypoglycemic drugs: Secondary | ICD-10-CM | POA: Insufficient documentation

## 2016-05-20 DIAGNOSIS — H578 Other specified disorders of eye and adnexa: Secondary | ICD-10-CM | POA: Insufficient documentation

## 2016-05-20 DIAGNOSIS — N83202 Unspecified ovarian cyst, left side: Secondary | ICD-10-CM

## 2016-05-20 DIAGNOSIS — Z79899 Other long term (current) drug therapy: Secondary | ICD-10-CM | POA: Insufficient documentation

## 2016-05-20 DIAGNOSIS — I10 Essential (primary) hypertension: Secondary | ICD-10-CM | POA: Insufficient documentation

## 2016-05-20 DIAGNOSIS — E119 Type 2 diabetes mellitus without complications: Secondary | ICD-10-CM | POA: Insufficient documentation

## 2016-05-20 DIAGNOSIS — Z791 Long term (current) use of non-steroidal anti-inflammatories (NSAID): Secondary | ICD-10-CM | POA: Insufficient documentation

## 2016-05-20 DIAGNOSIS — F17210 Nicotine dependence, cigarettes, uncomplicated: Secondary | ICD-10-CM | POA: Insufficient documentation

## 2016-05-20 LAB — URINALYSIS, ROUTINE W REFLEX MICROSCOPIC
BILIRUBIN URINE: NEGATIVE
GLUCOSE, UA: NEGATIVE mg/dL
Hgb urine dipstick: NEGATIVE
KETONES UR: NEGATIVE mg/dL
Leukocytes, UA: NEGATIVE
Nitrite: NEGATIVE
PH: 6 (ref 5.0–8.0)
Protein, ur: 30 mg/dL — AB
Specific Gravity, Urine: 1.017 (ref 1.005–1.030)

## 2016-05-20 LAB — URINE MICROSCOPIC-ADD ON: WBC, UA: NONE SEEN WBC/hpf (ref 0–5)

## 2016-05-20 LAB — CBG MONITORING, ED: GLUCOSE-CAPILLARY: 104 mg/dL — AB (ref 65–99)

## 2016-05-20 MED ORDER — CARVEDILOL 12.5 MG PO TABS: 12.5000 mg | ORAL_TABLET | Freq: Two times a day (BID) | ORAL | 0 refills | Status: DC

## 2016-05-20 MED ORDER — CARVEDILOL 25 MG PO TABS
25.0000 mg | ORAL_TABLET | Freq: Two times a day (BID) | ORAL | Status: DC
  Filled 2016-05-20: qty 1

## 2016-05-20 MED ORDER — TRAMADOL HCL 50 MG PO TABS: 50.0000 mg | ORAL_TABLET | Freq: Four times a day (QID) | ORAL | 0 refills | Status: AC | PRN

## 2016-05-20 MED ORDER — ERYTHROMYCIN 5 MG/GM OP OINT
TOPICAL_OINTMENT | Freq: Four times a day (QID) | OPHTHALMIC | Status: DC
  Administered 2016-05-20: 13:00:00 via OPHTHALMIC
  Filled 2016-05-20: qty 3.5

## 2016-05-20 MED ORDER — KETOROLAC TROMETHAMINE 30 MG/ML IJ SOLN
30.0000 mg | Freq: Once | INTRAMUSCULAR | Status: AC
  Administered 2016-05-20: 30 mg via INTRAMUSCULAR
  Filled 2016-05-20: qty 1

## 2016-05-20 MED ORDER — LISINOPRIL 10 MG PO TABS
20.0000 mg | ORAL_TABLET | Freq: Once | ORAL | Status: AC
  Administered 2016-05-20: 20 mg via ORAL
  Filled 2016-05-20: qty 2

## 2016-05-20 MED ORDER — LISINOPRIL 20 MG PO TABS: 20.0000 mg | ORAL_TABLET | Freq: Every day | ORAL | 0 refills | Status: DC

## 2016-05-20 NOTE — ED Triage Notes (Addendum)
bilat lower abd pain since yesterday, denies N/V/D, reports urinary burning, hx of L ovarian cyst. Pt also concerned about L eye drainage and itching.

## 2016-05-20 NOTE — ED Notes (Signed)
Pt ambulating in NAD 

## 2016-05-20 NOTE — ED Notes (Signed)
Given crackers and soda, states is feeling better.

## 2016-05-20 NOTE — Discharge Instructions (Signed)
Please read attached information. If you experience any new or worsening signs or symptoms please return to the emergency room for evaluation. Please follow-up with your primary care provider or specialist as discussed. Please use medication prescribed only as directed and discontinue taking if you have any concerning signs or symptoms.   °

## 2016-05-20 NOTE — ED Provider Notes (Signed)
[facility] Provider Note   CSN: [id number] Arrival date & time: 05/20/16  1016     History   Chief Complaint Chief Complaint  Patient presents with  . Abdominal Pain  . Eye Drainage    HPI Christine Callahan is a 40 y.o. female.  HPI   40 year old female presents today with complaints of abdominal pain and dysuria. Patient reports a significant past medical history of ovarian cyst on the left. She reports intermittent pain, reports that she has had surgery in the past. She notes intermittent pain usually resolves on its own. Patient reports that she's had pain since yesterday, she reports this is identical to previous ovarian pain. She reports slow onset, denies any other abdominal pain, denies any vaginal bleeding or discharge, nausea vomiting or diarrhea. Patient was seen several days ago for abdominal pain, CT scan showed no significant findings, she was treated for diverticulitis. She reports that that pain has improved, and this is different from that. She also reports dysuria, notes this seems similar to the dysuria she was having the other day with a negative urinalysis. Patient is status post partial hysterectomy.  Patient also reports that she's having left eye drainage, itchiness. She reports burning and redness yesterday that is improved with saline drops. She denies any vision changes, painful ocular movements, surrounding swelling or edema. She denies any foreign bodies.   Past Medical History:  Diagnosis Date  . Diabetes mellitus without complication (HCC)   . Genital herpes 2013  . GERD (gastroesophageal reflux disease)   . Gonorrhea 2015  . Hepatitis C 2016  . Hypertension   . Ovarian cyst     Patient Active Problem List   Diagnosis Date Noted  . Ingrown nail 09/19/2013  . Diabetic neuropathy (HCC) 09/19/2013  . Equinus deformity of foot, acquired 09/19/2013  . Plantar keratosis, acquired 09/19/2013  . Pain in lower limb 09/19/2013  . Lumbago  09/08/2011  . Pain in joint, ankle and foot 09/08/2011  . Sciatica of left side 09/08/2011    Past Surgical History:  Procedure Laterality Date  . ABDOMINAL HYSTERECTOMY    . CHOLECYSTECTOMY      OB History    No data available       Home Medications    Prior to Admission medications   Medication Sig Start Date End Date Taking? Authorizing Provider  cloNIDine (CATAPRES) 0.2 MG tablet Take 1 tablet (0.2 mg total) by mouth 2 (two) times daily. 02/10/16  Yes Will Jorja LoaMartin Camnitz, MD  gabapentin (NEURONTIN) 600 MG tablet Take 600 mg by mouth 2 (two) times daily.   Yes Historical Provider, MD  ibuprofen (ADVIL,MOTRIN) 800 MG tablet Take 1 tablet (800 mg total) by mouth 3 (three) times daily. 02/07/16  Yes Charlynne Panderavid Hsienta Yao, MD  metFORMIN (GLUCOPHAGE) 500 MG tablet Take 1,000 mg by mouth 2 (two) times daily with a meal.    Yes Historical Provider, MD  carvedilol (COREG) 12.5 MG tablet Take 1 tablet (12.5 mg total) by mouth 2 (two) times daily. 05/20/16   Eyvonne MechanicJeffrey Keshayla Schrum, PA-C  ciprofloxacin (CIPRO) 500 MG tablet Take 1 tablet (500 mg total) by mouth 2 (two) times daily. 04/17/16   Dione Boozeavid Glick, MD  cyclobenzaprine (FLEXERIL) 10 MG tablet Take 1 tablet (10 mg total) by mouth 3 (three) times daily as needed for muscle spasms. 11/07/15   John Molpus, MD  HYDROCHLOROTHIAZIDE PO Take 25 mg by mouth.     Historical Provider, MD  HYDROcodone-acetaminophen (NORCO) 5-325 MG tablet  Take 1-2 tablets by mouth every 6 (six) hours as needed (for pain). 03/20/16   John Molpus, MD  lisinopril (PRINIVIL,ZESTRIL) 20 MG tablet Take 1 tablet (20 mg total) by mouth daily. 05/20/16 08/18/16  Eyvonne MechanicJeffrey Iaan Oregel, PA-C  metroNIDAZOLE (FLAGYL) 500 MG tablet Take 1 tablet (500 mg total) by mouth 3 (three) times daily. 04/17/16   Dione Boozeavid Glick, MD  potassium chloride SA (K-DUR,KLOR-CON) 20 MEQ tablet Take 1 tablet (20 mEq total) by mouth daily. 03/20/16   John Molpus, MD  traMADol (ULTRAM) 50 MG tablet Take 1 tablet (50 mg total) by  mouth every 6 (six) hours as needed. 05/20/16   Eyvonne MechanicJeffrey Lovetta Condie, PA-C    Family History Family History  Problem Relation Age of Onset  . Lupus Mother   . Seizures Father     Social History Social History  Substance Use Topics  . Smoking status: Current Every Day Smoker    Packs/day: 0.50    Years: 20.00    Types: Cigarettes  . Smokeless tobacco: Never Used  . Alcohol use Yes     Comment: occasional     Allergies   Patient has no known allergies.   Review of Systems Review of Systems  All other systems reviewed and are negative.    Physical Exam Updated Vital Signs BP (!) 183/119 (BP Location: Right Arm)   Pulse 68   Temp 98.4 F (36.9 C) (Oral)   Resp 20   Ht 5\' 10"  (1.778 m)   Wt 127 kg   SpO2 99%   BMI 40.18 kg/m   Physical Exam  Constitutional: She is oriented to person, place, and time. She appears well-developed and well-nourished.  HENT:  Head: Normocephalic and atraumatic.  Eyes: Conjunctivae are normal. Pupils are equal, round, and reactive to light. Right eye exhibits no discharge. Left eye exhibits no discharge. No scleral icterus.  Neck: Normal range of motion. No JVD present. No tracheal deviation present.  Pulmonary/Chest: Effort normal. No stridor.  Abdominal:  Minor tenderness to palpation of the left lower quadrant and pelvic region, no rebound or guarding, no distention, no masses  Neurological: She is alert and oriented to person, place, and time. Coordination normal.  Psychiatric: She has a normal mood and affect. Her behavior is normal. Judgment and thought content normal.  Nursing note and vitals reviewed.    ED Treatments / Results  Labs (all labs ordered are listed, but only abnormal results are displayed) Labs Reviewed  URINALYSIS, ROUTINE W REFLEX MICROSCOPIC (NOT AT [facility]) - Abnormal; Notable for the following:       Result Value   Protein, ur 30 (*)    All other components within normal limits  URINE MICROSCOPIC-ADD ON -  Abnormal; Notable for the following:    Squamous Epithelial / LPF 0-5 (*)    Bacteria, UA FEW (*)    Casts HYALINE CASTS (*)    All other components within normal limits  CBG MONITORING, ED - Abnormal; Notable for the following:    Glucose-Capillary 104 (*)    All other components within normal limits    EKG  EKG Interpretation None       Radiology No results found.  Procedures Procedures (including critical care time)  Medications Ordered in ED Medications  erythromycin ophthalmic ointment ( Left Eye Given 05/20/16 1250)  ketorolac (TORADOL) 30 MG/ML injection 30 mg (30 mg Intramuscular Given 05/20/16 1108)  lisinopril (PRINIVIL,ZESTRIL) tablet 20 mg (20 mg Oral Given 05/20/16 1205)     Initial  Impression / Assessment and Plan / ED Course  I have reviewed the triage vital signs and the nursing notes.  Pertinent labs & imaging results that were available during my care of the patient were reviewed by me and considered in my medical decision making (see chart for details).  Clinical Course      Final Clinical Impressions(s) / ED Diagnoses   Final diagnoses:  Hypertension, unspecified type  Cyst of left ovary    Labs:  Imaging:  Consults:  Therapeutics:  Discharge Meds:   Assessment/Plan:   40 year old female presents today with complaints of abdominal pain. She reports this is identical to previous ovarian cyst pain. She reports no significant symptoms to require further evaluation and management. Very low suspicion for ovarian torsion, abscess, or any significant life-threatening. Disabling process. Patient agrees that this is identical to previous and does not want any further imaging or evaluation. 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 She has recently labs on her orange colored. Patient has run out of her blood pressure medication, does not have any pain medication. Patient reports not improved with  ibuprofen. She will be given a short course of Ultram, refill of her blood pressure medication, and erythromycin. Patient is instructed to follow-up with primary care provider and OB/GYN for reevaluation. She is encouraged to return to the emergency room if she has any new or worsening signs or symptoms. She verbalized understanding and agreement to today's plan had no further questions or concerns.     New Prescriptions Discharge Medication List as of 05/20/2016 12:23 PM       Eyvonne Mechanic, PA-C 05/20/16 1539    Laurence Spates, MD 05/21/16 [phone]

## 2016-05-20 NOTE — ED Notes (Signed)
ED Provider at bedside. 

## 2016-06-03 ENCOUNTER — Encounter (HOSPITAL_BASED_OUTPATIENT_CLINIC_OR_DEPARTMENT_OTHER): Payer: Self-pay | Admitting: Adult Health

## 2016-06-03 ENCOUNTER — Emergency Department (HOSPITAL_BASED_OUTPATIENT_CLINIC_OR_DEPARTMENT_OTHER): Payer: Self-pay

## 2016-06-03 ENCOUNTER — Observation Stay (HOSPITAL_BASED_OUTPATIENT_CLINIC_OR_DEPARTMENT_OTHER)
Admission: EM | Admit: 2016-06-03 | Discharge: 2016-06-04 | Disposition: A | Discharge status: Home / Self Care | Payer: Self-pay | Attending: Internal Medicine | Admitting: Internal Medicine

## 2016-06-03 DIAGNOSIS — E089 Diabetes mellitus due to underlying condition without complications: Secondary | ICD-10-CM | POA: Diagnosis present

## 2016-06-03 DIAGNOSIS — R079 Chest pain, unspecified: Secondary | ICD-10-CM | POA: Diagnosis present

## 2016-06-03 DIAGNOSIS — R0789 Other chest pain: Principal | ICD-10-CM | POA: Insufficient documentation

## 2016-06-03 DIAGNOSIS — Z7984 Long term (current) use of oral hypoglycemic drugs: Secondary | ICD-10-CM | POA: Insufficient documentation

## 2016-06-03 DIAGNOSIS — Z720 Tobacco use: Secondary | ICD-10-CM | POA: Diagnosis present

## 2016-06-03 DIAGNOSIS — I10 Essential (primary) hypertension: Secondary | ICD-10-CM | POA: Insufficient documentation

## 2016-06-03 DIAGNOSIS — F17210 Nicotine dependence, cigarettes, uncomplicated: Secondary | ICD-10-CM | POA: Insufficient documentation

## 2016-06-03 DIAGNOSIS — R002 Palpitations: Secondary | ICD-10-CM | POA: Insufficient documentation

## 2016-06-03 DIAGNOSIS — E1140 Type 2 diabetes mellitus with diabetic neuropathy, unspecified: Secondary | ICD-10-CM | POA: Insufficient documentation

## 2016-06-03 DIAGNOSIS — Z791 Long term (current) use of non-steroidal anti-inflammatories (NSAID): Secondary | ICD-10-CM | POA: Insufficient documentation

## 2016-06-03 DIAGNOSIS — Z79899 Other long term (current) drug therapy: Secondary | ICD-10-CM | POA: Insufficient documentation

## 2016-06-03 DIAGNOSIS — Z792 Long term (current) use of antibiotics: Secondary | ICD-10-CM | POA: Insufficient documentation

## 2016-06-03 DIAGNOSIS — E876 Hypokalemia: Secondary | ICD-10-CM | POA: Insufficient documentation

## 2016-06-03 DIAGNOSIS — Z6841 Body Mass Index (BMI) 40.0 and over, adult: Secondary | ICD-10-CM | POA: Insufficient documentation

## 2016-06-03 LAB — BASIC METABOLIC PANEL
Anion gap: 8 (ref 5–15)
BUN: 17 mg/dL (ref 6–20)
CHLORIDE: 105 mmol/L (ref 101–111)
CO2: 26 mmol/L (ref 22–32)
CREATININE: 1.07 mg/dL — AB (ref 0.44–1.00)
Calcium: 9.4 mg/dL (ref 8.9–10.3)
GFR calc Af Amer: >60 mL/min (ref >60)
GFR calc non Af Amer: >60 mL/min (ref >60)
GLUCOSE: 96 mg/dL (ref 65–99)
Potassium: 3.3 mmol/L — ABNORMAL LOW (ref 3.5–5.1)
Sodium: 139 mmol/L (ref 135–145)

## 2016-06-03 LAB — TROPONIN I: Troponin I: 0.03 ng/mL (ref <0.03)

## 2016-06-03 LAB — CBC
HCT: 45.6 % (ref 36.0–46.0)
Hemoglobin: 15.2 g/dL — ABNORMAL HIGH (ref 12.0–15.0)
MCH: 29 pg (ref 26.0–34.0)
MCHC: 33.3 g/dL (ref 30.0–36.0)
MCV: 87 fL (ref 78.0–100.0)
PLATELETS: 258 10*3/uL (ref 150–400)
RBC: 5.24 MIL/uL — AB (ref 3.87–5.11)
RDW: 13.8 % (ref 11.5–15.5)
WBC: 11.1 10*3/uL — ABNORMAL HIGH (ref 4.0–10.5)

## 2016-06-03 MED ORDER — ASPIRIN 81 MG PO CHEW
324.0000 mg | CHEWABLE_TABLET | Freq: Once | ORAL | Status: AC
  Administered 2016-06-03: 324 mg via ORAL
  Filled 2016-06-03: qty 4

## 2016-06-03 MED ORDER — NITROGLYCERIN 0.4 MG SL SUBL
0.4000 mg | SUBLINGUAL_TABLET | SUBLINGUAL | Status: AC | PRN
  Administered 2016-06-03 (×3): 0.4 mg via SUBLINGUAL
  Filled 2016-06-03: qty 1

## 2016-06-03 MED ORDER — CLONIDINE HCL 0.1 MG PO TABS
0.2000 mg | ORAL_TABLET | Freq: Once | ORAL | Status: AC
  Administered 2016-06-03: 0.2 mg via ORAL
  Filled 2016-06-03: qty 2

## 2016-06-03 MED ORDER — CARVEDILOL 12.5 MG PO TABS
12.5000 mg | ORAL_TABLET | Freq: Once | ORAL | Status: DC
  Filled 2016-06-03: qty 1

## 2016-06-03 NOTE — ED Notes (Signed)
States has been having left sided chest pain that radiates to left arm. Denies  N/V or SOB. States ahs been under stress x 2 weeks and smoking more.

## 2016-06-03 NOTE — ED Notes (Signed)
Dr. Elesa MassedWard aware that troponin is 0.03.

## 2016-06-03 NOTE — ED Notes (Signed)
Patient transported to X-ray 

## 2016-06-03 NOTE — ED Provider Notes (Signed)
By signing my name below, I, Christine Callahan, attest that this documentation has been prepared under the direction and in the presence of Christine N Ward, DO . Electronically Signed: Modena JanskyAlbert Callahan, Scribe. 06/03/2016. 11:08 PM.  TIME SEEN: 11:08 PM  CHIEF COMPLAINT: Chest pain  HPI:  HPI Comments: Christine Callahan is a 40 y.o. female with a PMHx of HTN, tobacco use, obesity who presents to the Emergency Department complaining of left-sided chest pain that started 1.5 hours ago. She states the pain was onset while sitting and watching TV. She describes the pain as a dull, aching, pressure sensation that radiates into her left shoulder. She reports no modifying factors. She reports associated symptoms of a chest pressure sensation and a warm feeling. She states she has a prior hx of similar complaint, which she does not recall further details. States she has never had a stress test or cardiac catheterization. She states that her brother passed away from an MI at age 41. No history of PE, DVT, exogenous estrogen use, fracture, surgery, trauma, hospitalization, prolonged travel. No lower extremity swelling or pain. No calf tenderness.  She denies SOB, nausea, vomiting, diaphoresis, dizziness, leg swelling/pain, fever, cough, or other complaints.     PCP: [facility]    ROS: See HPI Constitutional: no fever, no diaphoresis Eyes: no drainage  ENT: no runny nose   Cardiovascular:  +chest pain, no leg pain/swelling  Resp: no SOB  GI: no vomiting, no nausea GU: no dysuria Integumentary: no rash  Allergy: no hives  Musculoskeletal: no leg swelling  Neurological: no slurred speech, no dizziness,  ROS otherwise negative  PAST MEDICAL HISTORY/PAST SURGICAL HISTORY:  Past Medical History:  Diagnosis Date  . Diabetes mellitus without complication (HCC)   . Genital herpes 2013  . GERD (gastroesophageal reflux disease)   . Gonorrhea 2015  . Hepatitis C 2016  .  Hypertension   . Ovarian cyst     MEDICATIONS:  Prior to Admission medications   Medication Sig Start Date End Date Taking? Authorizing Provider  carvedilol (COREG) 12.5 MG tablet Take 1 tablet (12.5 mg total) by mouth 2 (two) times daily. 05/20/16   Eyvonne MechanicJeffrey Hedges, PA-C  ciprofloxacin (CIPRO) 500 MG tablet Take 1 tablet (500 mg total) by mouth 2 (two) times daily. 04/17/16   Dione Boozeavid Glick, MD  cloNIDine (CATAPRES) 0.2 MG tablet Take 1 tablet (0.2 mg total) by mouth 2 (two) times daily. 02/10/16   Will Jorja LoaMartin Camnitz, MD  cyclobenzaprine (FLEXERIL) 10 MG tablet Take 1 tablet (10 mg total) by mouth 3 (three) times daily as needed for muscle spasms. 11/07/15   John Molpus, MD  gabapentin (NEURONTIN) 600 MG tablet Take 600 mg by mouth 2 (two) times daily.    Historical Provider, MD  HYDROCHLOROTHIAZIDE PO Take 25 mg by mouth.     Historical Provider, MD  HYDROcodone-acetaminophen (NORCO) 5-325 MG tablet Take 1-2 tablets by mouth every 6 (six) hours as needed (for pain). 03/20/16   John Molpus, MD  ibuprofen (ADVIL,MOTRIN) 800 MG tablet Take 1 tablet (800 mg total) by mouth 3 (three) times daily. 02/07/16   Charlynne Panderavid Hsienta Yao, MD  lisinopril (PRINIVIL,ZESTRIL) 20 MG tablet Take 1 tablet (20 mg total) by mouth daily. 05/20/16 08/18/16  Eyvonne MechanicJeffrey Hedges, PA-C  metFORMIN (GLUCOPHAGE) 500 MG tablet Take 1,000 mg by mouth 2 (two) times daily with a meal.     Historical Provider, MD  metroNIDAZOLE (FLAGYL) 500 MG tablet Take 1 tablet (500 mg total)  by mouth 3 (three) times daily. 04/17/16   Dione Boozeavid Glick, MD  potassium chloride SA (K-DUR,KLOR-CON) 20 MEQ tablet Take 1 tablet (20 mEq total) by mouth daily. 03/20/16   John Molpus, MD  traMADol (ULTRAM) 50 MG tablet Take 1 tablet (50 mg total) by mouth every 6 (six) hours as needed. 05/20/16   Eyvonne MechanicJeffrey Hedges, PA-C    ALLERGIES:  No Known Allergies  SOCIAL HISTORY:  Social History  Substance Use Topics  . Smoking status: Current Every Day Smoker    Packs/day: 0.50     Years: 20.00    Types: Cigarettes  . Smokeless tobacco: Never Used  . Alcohol use Yes     Comment: occasional    FAMILY HISTORY: Family History  Problem Relation Age of Onset  . Lupus Mother   . Seizures Father     EXAM: BP (!) 178/110   Pulse 86   Temp 99.6 F (37.6 C) (Oral)   Resp 18   Ht 5\' 10"  (1.778 m)   Wt 280 lb (127 kg)   SpO2 99%   BMI 40.18 kg/m  CONSTITUTIONAL: Alert and oriented and responds appropriately to questions. Well-appearing; well-nourished, obese HEAD: Normocephalic EYES: Conjunctivae clear, PERRL, EOMI ENT: normal nose; no rhinorrhea; moist mucous membranes NECK: Supple, no meningismus, no nuchal rigidity, no LAD  CARD: RRR; S1 and S2 appreciated; no murmurs, no clicks, no rubs, no gallops CHEST:  Nontender to palpation without crepitus, ecchymosis or deformity. No flail chest. No rash or other lesions noted. RESP: Normal chest excursion without splinting or tachypnea; breath sounds clear and equal bilaterally; no wheezes, no rhonchi, no rales, no hypoxia or respiratory distress, speaking full sentences ABD/GI: Normal bowel sounds; non-distended; soft, non-tender, no rebound, no guarding, no peritoneal signs, no hepatosplenomegaly BACK:  The back appears normal and is non-tender to palpation, there is no CVA tenderness EXT: Normal ROM in all joints; non-tender to palpation; no edema; normal capillary refill; no cyanosis, no calf tenderness or swelling    SKIN: Normal color for age and race; warm; no rash NEURO: Moves all extremities equally, sensation to light touch intact diffusely, cranial nerves II through XII intact, normal speech PSYCH: The patient's mood and manner are appropriate. Grooming and personal hygiene are appropriate.  MEDICAL DECISION MAKING: Patient here with chest pain. Has history of hypertension, tobacco use and significant family history. She [facility] not [facility]. Has never had stress test, cardiac  catheterization. Is extremely hypertensive today but states that this is normal for her. She has taken her Coreg, clonidine, HCTZ and lisinopril this morning in his duodenum Coreg and clonidine currently. Her EKG does show repolarization abnormality but is unchanged compared to prior. This may be secondary to chronic hypertension. She has a heart score of 4. I feel she will need admission. We'll give aspirin, nitroglycerin and reassess. She agrees with this plan.  ED PROGRESS: 11:45 PM  Pt's troponin is 0.03. Labs otherwise unremarkable. Chest x-ray clear with no sign of cardiomegaly or widened mediastinum. X-ray read as "cannot exclude possible nondisplaced left lateral sixth rib fracture". She denies any injury. She is nontender over this area. Pain completely gone after 3 nitroglycerin tablets. Blood pressure is still 170/100.  We will give her her home nighttime clonidine and Coreg. [facility].  12:15 AM  [facility].  [facility] Will discuss with hospitalist there. Patient  is comfortable with this plan. Blood pressure now 158/81. Still chest pain-free.   12:30 AM  Discussed patient's case with hospitalist, Dr. Katrinka Blazing.  Recommend admission to telemetry, observation bed.  I will place holding orders per their request. Patient and family (if present) updated with plan.   I reviewed all nursing notes, vitals, pertinent old records, EKGs, labs, imaging (as available).    EKG Interpretation  Date/Time:  Saturday June 03 2016 22:24:02 EST Ventricular Rate:  94 PR Interval:    QRS Duration: 87 QT Interval:  339 QTC Calculation: 424 R Axis:   65 Text Interpretation:  Sinus rhythm Borderline repolarization abnormality No significant change since last tracing Confirmed by WARD,  DO, Christine [phone]) on 06/03/2016 11:03:35 PM         I personally performed the services described in this  documentation, which was scribed in my presence. The recorded information has been reviewed and is accurate.     Layla Maw Ward, DO 06/04/16 [phone]

## 2016-06-03 NOTE — ED Triage Notes (Signed)
Presents with left sided chest pain that began one hour ago while sitting down and watching TV, denies sOB, denies nausea. Pain is described as dull and achy. Pain radiates into left shoulder. Nothing makes pain better, nothing makes pain worse.

## 2016-06-04 ENCOUNTER — Observation Stay (HOSPITAL_COMMUNITY): Payer: Self-pay

## 2016-06-04 ENCOUNTER — Other Ambulatory Visit: Payer: Self-pay | Admitting: Physician Assistant

## 2016-06-04 ENCOUNTER — Observation Stay (HOSPITAL_BASED_OUTPATIENT_CLINIC_OR_DEPARTMENT_OTHER): Payer: Self-pay

## 2016-06-04 DIAGNOSIS — E089 Diabetes mellitus due to underlying condition without complications: Secondary | ICD-10-CM | POA: Diagnosis present

## 2016-06-04 DIAGNOSIS — Z720 Tobacco use: Secondary | ICD-10-CM | POA: Diagnosis present

## 2016-06-04 DIAGNOSIS — R002 Palpitations: Secondary | ICD-10-CM | POA: Diagnosis present

## 2016-06-04 DIAGNOSIS — R079 Chest pain, unspecified: Secondary | ICD-10-CM

## 2016-06-04 DIAGNOSIS — E876 Hypokalemia: Secondary | ICD-10-CM

## 2016-06-04 DIAGNOSIS — I10 Essential (primary) hypertension: Secondary | ICD-10-CM | POA: Diagnosis present

## 2016-06-04 LAB — CREATININE, SERUM
Creatinine, Ser: 1.11 mg/dL — ABNORMAL HIGH (ref 0.44–1.00)
GFR calc Af Amer: >60 mL/min (ref >60)
GFR calc non Af Amer: >60 mL/min (ref >60)

## 2016-06-04 LAB — GLUCOSE, CAPILLARY
GLUCOSE-CAPILLARY: 132 mg/dL — AB (ref 65–99)
GLUCOSE-CAPILLARY: 164 mg/dL — AB (ref 65–99)
Glucose-Capillary: 139 mg/dL — ABNORMAL HIGH (ref 65–99)

## 2016-06-04 LAB — CBC
HCT: 43.4 % (ref 36.0–46.0)
Hemoglobin: 14.5 g/dL (ref 12.0–15.0)
MCH: 29.1 pg (ref 26.0–34.0)
MCHC: 33.4 g/dL (ref 30.0–36.0)
MCV: 87.1 fL (ref 78.0–100.0)
PLATELETS: 215 10*3/uL (ref 150–400)
RBC: 4.98 MIL/uL (ref 3.87–5.11)
RDW: 14 % (ref 11.5–15.5)
WBC: 9.3 10*3/uL (ref 4.0–10.5)

## 2016-06-04 LAB — ECHOCARDIOGRAM COMPLETE
HEIGHTINCHES: 69 in
WEIGHTICAEL: 4304 [oz_av]

## 2016-06-04 LAB — TROPONIN I
TROPONIN I: 0.03 ng/mL — AB (ref <0.03)
Troponin I: <0.03 ng/mL (ref <0.03)
Troponin I: <0.03 ng/mL (ref <0.03)

## 2016-06-04 MED ORDER — ONDANSETRON HCL 4 MG/2ML IJ SOLN: 4.0000 mg | Freq: Four times a day (QID) | INTRAMUSCULAR | Status: DC | PRN

## 2016-06-04 MED ORDER — CARVEDILOL 12.5 MG PO TABS: 25.0000 mg | ORAL_TABLET | Freq: Two times a day (BID) | ORAL | 0 refills | Status: DC

## 2016-06-04 MED ORDER — CARVEDILOL 12.5 MG PO TABS
12.5000 mg | ORAL_TABLET | Freq: Two times a day (BID) | ORAL | Status: DC
  Administered 2016-06-04 (×2): 12.5 mg via ORAL
  Filled 2016-06-04 (×2): qty 1

## 2016-06-04 MED ORDER — LISINOPRIL 10 MG PO TABS
20.0000 mg | ORAL_TABLET | Freq: Every day | ORAL | Status: DC
  Administered 2016-06-04: 20 mg via ORAL
  Filled 2016-06-04: qty 2

## 2016-06-04 MED ORDER — POTASSIUM CHLORIDE CRYS ER 20 MEQ PO TBCR
40.0000 meq | EXTENDED_RELEASE_TABLET | Freq: Once | ORAL | Status: AC
  Administered 2016-06-04: 40 meq via ORAL
  Filled 2016-06-04: qty 2

## 2016-06-04 MED ORDER — NICOTINE 21 MG/24HR TD PT24: 21.0000 mg | MEDICATED_PATCH | Freq: Every day | TRANSDERMAL | 0 refills | Status: DC

## 2016-06-04 MED ORDER — ENOXAPARIN SODIUM 40 MG/0.4ML ~~LOC~~ SOLN
40.0000 mg | SUBCUTANEOUS | Status: DC
  Filled 2016-06-04: qty 0.4

## 2016-06-04 MED ORDER — GABAPENTIN 600 MG PO TABS
600.0000 mg | ORAL_TABLET | Freq: Two times a day (BID) | ORAL | Status: DC
  Administered 2016-06-04: 600 mg via ORAL
  Filled 2016-06-04: qty 1

## 2016-06-04 MED ORDER — HYDROCHLOROTHIAZIDE 25 MG PO TABS
25.0000 mg | ORAL_TABLET | Freq: Every day | ORAL | Status: DC
  Administered 2016-06-04: 25 mg via ORAL
  Filled 2016-06-04: qty 1

## 2016-06-04 MED ORDER — ACETAMINOPHEN 325 MG PO TABS: 650.0000 mg | ORAL_TABLET | ORAL | Status: DC | PRN

## 2016-06-04 MED ORDER — INSULIN ASPART 100 UNIT/ML ~~LOC~~ SOLN: 0.0000 [IU] | Freq: Three times a day (TID) | SUBCUTANEOUS | Status: DC

## 2016-06-04 MED ORDER — POTASSIUM CHLORIDE CRYS ER 20 MEQ PO TBCR: 20.0000 meq | EXTENDED_RELEASE_TABLET | Freq: Every day | ORAL | 0 refills | Status: DC

## 2016-06-04 MED ORDER — NICOTINE 21 MG/24HR TD PT24
21.0000 mg | MEDICATED_PATCH | Freq: Every day | TRANSDERMAL | Status: DC
  Administered 2016-06-04: 21 mg via TRANSDERMAL
  Filled 2016-06-04: qty 1

## 2016-06-04 MED ORDER — CLONIDINE HCL 0.2 MG PO TABS
0.2000 mg | ORAL_TABLET | Freq: Two times a day (BID) | ORAL | Status: DC
  Administered 2016-06-04: 0.2 mg via ORAL
  Filled 2016-06-04: qty 1

## 2016-06-04 NOTE — Progress Notes (Signed)
Dr. Gonzella Lexhungel called back and stated he would call potassium prescription to patients pharmacy and patient could pick up. Patient called and  made aware. Christine Callahan, Randall AnKristin Jessup RN

## 2016-06-04 NOTE — Progress Notes (Signed)
[facility]  discussed with Dr. Elesa MassedWard Ms. Christine MayhewLeach is a 40 year old female with pmh HTN, tobacco abuse, and obesity; who presents with complaints of left-sided chest pain while sitting. +FH brother passed away form MI at age 41. Patient given 3 nitroglycerin now CP free. WBC 11.1, hemoglobin 15.2, potassium 3.3, troponin 0.03. CXR reveals possible left rib fracture. HEART score= 4. Patient has orders for Holter monitor from back in 02/2016 and is followed by Dr Christine Callahan of cardiology for palpitations. Admitted for observation to a telemetry bed with cycle troponins, and consult cardiology possibly in a.m.

## 2016-06-04 NOTE — Progress Notes (Signed)
Pt arrived [facility].  Telemetry applied and CCMD notified x 2.  Pt oriented to room including call light and telephone.  Paged admissions to notify of Pt arrival.  Pt general plan of care discussed with Pt and significant other.  Current lab values discussed and explained.  All questions answered.  Pt c/o dull chest pain 3/10 at this time.  Will alert attending and continue to monitor.

## 2016-06-04 NOTE — ED Notes (Signed)
Re-paged [facility]

## 2016-06-04 NOTE — ED Notes (Signed)
[facility] Tele beds [facility]. [facility] Hospital[facility]

## 2016-06-04 NOTE — H&P (Signed)
TRH H&P    Patient Demographics:    Christine Callahan, is a 40 y.o. female  MRN: [id number]  DOB - 1976-05-22  Admit Date - 06/03/2016  Referring MD/NP/PA: Dr. Elesa MassedWard  Outpatient Primary MD for the patient is [facility]  Patient coming from: [facility] [place]  Chief Complaint  Patient presents with  . Chest Pain      HPI:    Christine Callahan  is a 40 y.o. female, With history of hypertension, obesity, diabetes mellitus who came to the ED with complaints of left-sided chest pain which started about 9 PM last night. Patient says that pain  began while she was sitting in the car and described as dull aching pressure sensation which radiates left shoulder. At this time patient is chest pain-free. She denies nausea and vomiting. Denies shortness of breath. Patient was seen by cardiology for palpitations.   . Review of systems:    In addition to the HPI above,  No Fever-chills, No Headache, No changes with Vision or hearing, No problems swallowing food or Liquids,, No Abdominal pain, No Nausea or Vomiting, bowel movements are regular, No Blood in stool or Urine, No dysuria, No new skin rashes or bruises, No new joints pains-aches,  No new weakness, tingling, numbness in any extremity, No recent weight gain or loss, No polyuria, polydypsia or polyphagia, No significant Mental Stressors.  A full 10 point Review of Systems was done, except as stated above, all other Review of Systems were negative.   With Past History of the following :    Past Medical History:  Diagnosis Date  . Diabetes mellitus without complication (HCC)   . Genital herpes 2013  . GERD (gastroesophageal reflux disease)   . Gonorrhea 2015  . Hepatitis C 2016  . Hypertension   . Ovarian cyst       Past Surgical History:  Procedure Laterality Date  . ABDOMINAL HYSTERECTOMY    . CHOLECYSTECTOMY         Social History:      Social History  Substance Use Topics  . Smoking status: Current Every Day Smoker    Packs/day: 0.50    Years: 20.00    Types: Cigarettes  . Smokeless tobacco: Never Used  . Alcohol use Yes     Comment: occasional       Family History :     Family History  Problem Relation Age of Onset  . Lupus Mother   . Seizures Father    Patient's brother died of MI at the age of 41   Home Medications:   Prior to Admission medications   Medication Sig Start Date End Date Taking? Authorizing Provider  carvedilol (COREG) 12.5 MG tablet Take 1 tablet (12.5 mg total) by mouth 2 (two) times daily. 05/20/16   Eyvonne MechanicJeffrey Hedges, PA-C  ciprofloxacin (CIPRO) 500 MG tablet Take 1 tablet (500 mg total) by mouth 2 (two) times daily. 04/17/16   Dione Boozeavid Glick, MD  cloNIDine (CATAPRES) 0.2 MG tablet Take 1 tablet (0.2 mg total)  by mouth 2 (two) times daily. 02/10/16   Will Jorja LoaMartin Camnitz, MD  cyclobenzaprine (FLEXERIL) 10 MG tablet Take 1 tablet (10 mg total) by mouth 3 (three) times daily as needed for muscle spasms. 11/07/15   John Molpus, MD  gabapentin (NEURONTIN) 600 MG tablet Take 600 mg by mouth 2 (two) times daily.    Historical Provider, MD  HYDROCHLOROTHIAZIDE PO Take 25 mg by mouth.     Historical Provider, MD  HYDROcodone-acetaminophen (NORCO) 5-325 MG tablet Take 1-2 tablets by mouth every 6 (six) hours as needed (for pain). 03/20/16   John Molpus, MD  ibuprofen (ADVIL,MOTRIN) 800 MG tablet Take 1 tablet (800 mg total) by mouth 3 (three) times daily. 02/07/16   Charlynne Panderavid Hsienta Yao, MD  lisinopril (PRINIVIL,ZESTRIL) 20 MG tablet Take 1 tablet (20 mg total) by mouth daily. 05/20/16 08/18/16  Eyvonne MechanicJeffrey Hedges, PA-C  metFORMIN (GLUCOPHAGE) 500 MG tablet Take 1,000 mg by mouth 2 (two) times daily with a meal.     Historical Provider, MD  metroNIDAZOLE (FLAGYL) 500 MG tablet Take 1 tablet (500 mg total) by mouth 3 (three) times daily. 04/17/16   Dione Boozeavid Glick, MD  potassium chloride SA  (K-DUR,KLOR-CON) 20 MEQ tablet Take 1 tablet (20 mEq total) by mouth daily. 03/20/16   John Molpus, MD  traMADol (ULTRAM) 50 MG tablet Take 1 tablet (50 mg total) by mouth every 6 (six) hours as needed. 05/20/16   Eyvonne MechanicJeffrey Hedges, PA-C     Allergies:    No Known Allergies   Physical Exam:   Vitals  Blood pressure 113/73, pulse 80, temperature 98.2 F (36.8 C), temperature source Oral, resp. rate 18, height 5\' 9"  (1.753 m), weight 122 kg (269 lb), SpO2 99 %.  1.  General: Appears in no acute distress  2. Psychiatric:  Intact judgement and  insight, awake alert, oriented x 3.  3. Neurologic: No focal neurological deficits, all cranial nerves intact.Strength 5/5 all 4 extremities, sensation intact all 4 extremities, plantars down going.  4. Eyes :  anicteric sclerae, moist conjunctivae with no lid lag. PERRLA.  5. ENMT:  Oropharynx clear with moist mucous membranes and good dentition  6. Neck:  supple, no cervical lymphadenopathy appriciated, No thyromegaly  7. Respiratory : Normal respiratory effort, good air movement bilaterally,clear to  auscultation bilaterally  8. Cardiovascular : RRR, no gallops, rubs or murmurs, no leg edema  9. Gastrointestinal:  Positive bowel sounds, abdomen soft, non-tender to palpation,no hepatosplenomegaly, no rigidity or guarding       10. Skin:  No cyanosis, normal texture and turgor, no rash, lesions or ulcers  11.Musculoskeletal:  Good muscle tone,  joints appear normal , no effusions,  normal range of motion    Data Review:    CBC  Recent Labs Lab 06/03/16 2245  WBC 11.1*  HGB 15.2*  HCT 45.6  PLT 258  MCV 87.0  MCH 29.0  MCHC 33.3  RDW 13.8   ------------------------------------------------------------------------------------------------------------------  Chemistries   Recent Labs Lab 06/03/16 2245  NA 139  K 3.3*  CL 105  CO2 26  GLUCOSE 96  BUN 17  CREATININE 1.07*  CALCIUM 9.4    ------------------------------------------------------------------------------------------------------------------  -----------------------------------------------------------Cardiac Enzymes:  Recent Labs Lab 06/03/16 2245  TROPONINI 0.03*    --------------------------------------------------------------------------------------------------------------- Urine analysis:    Component Value Date/Time   COLORURINE YELLOW 05/20/2016 1110   APPEARANCEUR CLEAR 05/20/2016 1110   LABSPEC 1.017 05/20/2016 1110   PHURINE 6.0 05/20/2016 1110   GLUCOSEU NEGATIVE 05/20/2016 1110   HGBUR NEGATIVE  05/20/2016 1110   BILIRUBINUR NEGATIVE 05/20/2016 1110   KETONESUR NEGATIVE 05/20/2016 1110   PROTEINUR 30 (A) 05/20/2016 1110   UROBILINOGEN 0.2 09/24/2014 1145   NITRITE NEGATIVE 05/20/2016 1110   LEUKOCYTESUR NEGATIVE 05/20/2016 1110      Imaging Results:    Dg Chest 2 View  Result Date: 06/03/2016 CLINICAL DATA:  Patient with left-sided chest pain for 1 hour. EXAM: CHEST  2 VIEW COMPARISON:  Chest radiograph 03/19/2016. FINDINGS: Monitoring leads overlie the patient. Normal cardiac and mediastinal contours. No consolidative pulmonary opacities. No pleural effusion pneumothorax. Thoracic spine degenerative changes. Cannot exclude nondisplaced left lateral sixth rib fracture. IMPRESSION: Cannot exclude possible nondisplaced left lateral 6 rib fracture. Otherwise no acute cardiopulmonary process. Electronically Signed   By: Annia Belt M.D.   On: 06/03/2016 23:17    My personal review of EKG: Rhythm NSR, ST changes same as previous EKG   Assessment & Plan:    Active Problems:   Chest pain   1. Chest pain- place under observation to rule out ACS, obtain serial troponin every 6 hours 3. Will repeat EKG in a.m. 2. Hypokalemia- replace potassium and check BMP in a.m. 3. Questionable left sixth rib fracture- check rib series on left. 4. Diabetes mellitus- start sliding scale insulin with  NovoLog.   DVT Prophylaxis-   Lovenox   AM Labs Ordered, also please review Full Orders  Family Communication: Admission, patients condition and plan of care including tests being ordered have been discussed with the patient and family member at bedside who indicate understanding and agree with the plan and Code Status.  Code Status: Full code  Admission status: Observation    Time spent in minutes : 60 minutes   Amalie Koran S M.D on 06/04/2016 at 2:59 AM  Between 7am to 7pm - Pager - [phone]. After 7pm go to [email or web address] - password [facility]  [facility] - Office  [phone]

## 2016-06-04 NOTE — Progress Notes (Signed)
Patient questioning when she may get to be dishcarged, Dr. Gonzella Lexhungel [facility] and made aware. Spoke with Echo department and they stated that Echo will be done after lunch time hour. Dr. Gonzella Lexhungel made aware and stated that echo needed to be done prior to discharge. Will make patient aware. Aditi Rovira, Randall AnKristin Jessup RN

## 2016-06-04 NOTE — Discharge Summary (Signed)
Physician Discharge Summary  Christine BrownerChrista L Leach ZOX:[id number] DOB: 1975-08-02 DOA: 06/03/2016  PCP: [facility]  Admit date: 06/03/2016 Discharge date: 06/04/2016  Admitted From:Home Disposition:  Home  Recommendations for Outpatient Follow-up:  1. Follow-up with [facility] cardiology (office will call with appointment for stress test)  Home Health: None Equipment/Devices: None  Discharge Condition: Fair CODE STATUS: Full code Diet recommendation: Carb modified   Discharge Diagnoses:   Principal problem Chest pain at rest   Active Problems:   Diabetic neuropathy (HCC)   Essential hypertension   Diabetes mellitus due to underlying condition (HCC)   Heart palpitations   Morbid obesity (HCC)   Tobacco abuse  Brief narrative/history of present illness 40 year old obese female with history of diabetes mellitus on oral hypoglycemic, essential hypertension, history of palpitations, active tobacco use who presented with left-sided chest pain radiating to her shoulders. Patient was driving home when she had the symptoms, dull aching, 7/10 in severity without any aggravating or relieving factors. Denies similar symptoms in the past. Denies shortness of breath, headache, blurred vision, nausea, vomiting, abdominal pain, bowel or urinary symptoms. Symptoms improved after receiving nitrates in the ED. Course in the ED Patient was hypotensive with normal remaining vitals. Blood work showed normal CBC, creatinine 1.1 and 2.0 0.03. EKG showed mild T-wave inversion in inferior and lateral leads. Patient observed on telemetry.  Chest pain Has both typical and atypical symptoms. Symptoms not resolve. Stable on telemetry. Serial cardiac enzymes negative. Seen by cardiology and it appeared to her symptoms today were atypical. Recommend outpatient nuclear stress testing. 2-D echo done shows normal EF with no wall motion abnormality and related to diastolic  dysfunction. Patient is stable to be discharged home.  Palpitations Has some sinus tachycardia on telemetry. Cardiology recommends increasing Coreg to 25 mg twice a day. She had seen Dr Gerre Pebblesamitz for palpitations back in August and he recommended 48-hour Holter monitoring but because of insurance issues she could not get it done. Follow up with him as outpatient.  Essential hypertension Elevated blood pressure on admission due to chest pain. Now stable. Resume home medications.  Type 2 diabetes mellitus with neuropathy Continue metformin. Continue Neurontin.   Hypokalemia Replenished  Tobacco abuse Counseled strongly on cessation. Prescribed nicotine patch.  Obesity Counseled on diet and exercise.  Family communication: Boyfriend at bedside  Consults: Cardiology   Discharge Instructions     Medication List    STOP taking these medications   cyclobenzaprine 10 MG tablet Commonly known as:  FLEXERIL     Medication dose changed  carvedilol 12.5 MG tablet Commonly known as:  COREG Take 2 tablet (25 mg total) by mouth 2 (two) times daily.   Continue these medications upon discharge   cloNIDine 0.2 MG tablet Commonly known as:  CATAPRES Take 1 tablet (0.2 mg total) by mouth 2 (two) times daily.   gabapentin 400 MG capsule Commonly known as:  NEURONTIN Take 400 mg by mouth 2 (two) times daily as needed (pain).   hydrochlorothiazide 25 MG tablet Commonly known as:  HYDRODIURIL Take 25 mg by mouth daily.   HYDROcodone-acetaminophen 5-325 MG tablet Commonly known as:  NORCO Take 1-2 tablets by mouth every 6 (six) hours as needed (for pain).   ibuprofen 800 MG tablet Commonly known as:  ADVIL,MOTRIN Take 1 tablet (800 mg total) by mouth 3 (three) times daily.   lisinopril 20 MG tablet Commonly known as:  PRINIVIL,ZESTRIL Take 1 tablet (20 mg total) by mouth daily.  metFORMIN 1000 MG tablet Commonly known as:  GLUCOPHAGE Take 1,000 mg by mouth 2 (two) times  daily with a meal.   multivitamin with minerals Tabs tablet Take 1 tablet by mouth daily.   nicotine 21 mg/24hr patch Commonly known as:  NICODERM CQ - dosed in mg/24 hours Place 1 patch (21 mg total) onto the skin daily. Start taking on:  06/05/2016   potassium chloride SA 20 MEQ tablet Commonly known as:  K-DUR,KLOR-CON Take 1 tablet (20 mEq total) by mouth daily.   traMADol 50 MG tablet Commonly known as:  ULTRAM Take 1 tablet (50 mg total) by mouth every 6 (six) hours as needed.      Follow-up Information    Will Jorja LoaMartin Camnitz, MD Follow up.   Specialty:  Cardiology Why:  the office will call you to arrange a stress test. Contact information: [address] [place] [address] [phone]          No Known Allergies     Procedures/Studies: Dg Chest 2 View  Result Date: 06/03/2016 CLINICAL DATA:  Patient with left-sided chest pain for 1 hour. EXAM: CHEST  2 VIEW COMPARISON:  Chest radiograph 03/19/2016. FINDINGS: Monitoring leads overlie the patient. Normal cardiac and mediastinal contours. No consolidative pulmonary opacities. No pleural effusion pneumothorax. Thoracic spine degenerative changes. Cannot exclude nondisplaced left lateral sixth rib fracture. IMPRESSION: Cannot exclude possible nondisplaced left lateral 6 rib fracture. Otherwise no acute cardiopulmonary process. Electronically Signed   By: Annia Beltrew  Davis M.D.   On: 06/03/2016 23:17   Dg Ribs Unilateral Left  Result Date: 06/04/2016 CLINICAL DATA:  Left-sided chest pain, no known injury, initial encounter EXAM: LEFT RIBS - 2 VIEW COMPARISON:  06/03/2016 FINDINGS: No fracture or other bone lesions are seen involving the ribs. IMPRESSION: No acute abnormality noted. Electronically Signed   By: Alcide CleverMark  Lukens M.D.   On: 06/04/2016 08:27     2-D echo Study Conclusions  - Left ventricle: The cavity size was normal. There was moderate   concentric hypertrophy. Systolic function was normal.  The   estimated ejection fraction was in the range of 60% to 65%. Wall   motion was normal; there were no regional wall motion   abnormalities. Features are consistent with a pseudonormal left   ventricular filling pattern, with concomitant abnormal relaxation   and increased filling pressure (grade 2 diastolic dysfunction). - Left atrium: The atrium was mildly dilated.   Subjective: Denies further chest pain symptoms.   Discharge Exam: Vitals:   06/04/16 0529 06/04/16 1153  BP: (!) 138/93 (!) 155/87  Pulse: 60   Resp: 18   Temp: 98.4 F (36.9 C)    Vitals:   06/04/16 0125 06/04/16 0219 06/04/16 0529 06/04/16 1153  BP: (!) 155/104 113/73 (!) 138/93 (!) 155/87  Pulse:  80 60   Resp:  18 18   Temp:  98.2 F (36.8 C) 98.4 F (36.9 C)   TempSrc:  Oral Oral   SpO2:  99% 100%   Weight:  122 kg (269 lb)    Height:  5\' 9"  (1.753 m)      General: Middle aged obese female not in distress HEENT: Moist mucosa, supple neck Cardiovascular: RRR, S1/S2 +, no murmurs, no rubs, no gallops Chest: Clear bilaterally, no added sounds GI: Soft, nondistended, nontender,  Musculoskeletal: Warm, no edema     The results of significant diagnostics from this hospitalization (including imaging, microbiology, ancillary and laboratory) are listed below for reference.  Microbiology: No results found for this or any previous visit (from the past 240 hour(s)).   Labs: BNP (last 3 results) No results for input(s): BNP in the last 8760 hours. Basic Metabolic Panel:  Recent Labs Lab 06/03/16 2245 06/04/16 0336  NA 139  --   K 3.3*  --   CL 105  --   CO2 26  --   GLUCOSE 96  --   BUN 17  --   CREATININE 1.07* 1.11*  CALCIUM 9.4  --    Liver Function Tests: No results for input(s): AST, ALT, ALKPHOS, BILITOT, PROT, ALBUMIN in the last 168 hours. No results for input(s): LIPASE, AMYLASE in the last 168 hours. No results for input(s): AMMONIA in the last 168 hours. CBC:  Recent  Labs Lab 06/03/16 2245 06/04/16 0336  WBC 11.1* 9.3  HGB 15.2* 14.5  HCT 45.6 43.4  MCV 87.0 87.1  PLT 258 215   Cardiac Enzymes:  Recent Labs Lab 06/03/16 2245 06/04/16 0336 06/04/16 0919 06/04/16 1440  TROPONINI 0.03* 0.03* <0.03 <0.03   BNP: Invalid input(s): POCBNP CBG:  Recent Labs Lab 06/04/16 0705 06/04/16 1205  GLUCAP 132* 164*   D-Dimer No results for input(s): DDIMER in the last 72 hours. Hgb A1c No results for input(s): HGBA1C in the last 72 hours. Lipid Profile No results for input(s): CHOL, HDL, LDLCALC, TRIG, CHOLHDL, LDLDIRECT in the last 72 hours. Thyroid function studies No results for input(s): TSH, T4TOTAL, T3FREE, THYROIDAB in the last 72 hours.  Invalid input(s): FREET3 Anemia work up No results for input(s): VITAMINB12, FOLATE, FERRITIN, TIBC, IRON, RETICCTPCT in the last 72 hours. Urinalysis    Component Value Date/Time   COLORURINE YELLOW 05/20/2016 1110   APPEARANCEUR CLEAR 05/20/2016 1110   LABSPEC 1.017 05/20/2016 1110   PHURINE 6.0 05/20/2016 1110   GLUCOSEU NEGATIVE 05/20/2016 1110   HGBUR NEGATIVE 05/20/2016 1110   BILIRUBINUR NEGATIVE 05/20/2016 1110   KETONESUR NEGATIVE 05/20/2016 1110   PROTEINUR 30 (A) 05/20/2016 1110   UROBILINOGEN 0.2 09/24/2014 1145   NITRITE NEGATIVE 05/20/2016 1110   LEUKOCYTESUR NEGATIVE 05/20/2016 1110   Sepsis Labs Invalid input(s): PROCALCITONIN,  WBC,  LACTICIDVEN Microbiology No results found for this or any previous visit (from the past 240 hour(s)).   Time coordinating discharge: < 30 minutes  SIGNED:   Eddie North, MD  [facility] 06/04/2016, 3:45 PM Pager   If 7PM-7AM, please contact night-coverage [email or web address] Password [email or web address]

## 2016-06-04 NOTE — Progress Notes (Addendum)
Patient does not wish to stay for potassium prescription pt stated will call pharmacy tomorrow.pt discharged via wheelchair to main entrance Rhyder Koegel, Randall AnKristin Jessup RN

## 2016-06-04 NOTE — Progress Notes (Signed)
Patient given discharge instructions medication list and paper prescriptions. Patient had a question about potassium prescription pt does not have at home Dr. Gonzella Lexhungel [facility] and made aware. Will await call back. Pt given follow up appointments also IV and tele dcd will discharge home as ordered. Shrey Boike, Randall AnKristin Jessup RN

## 2016-06-04 NOTE — Consult Note (Signed)
CARDIOLOGY CONSULT NOTE     Primary Care Physician: [facility] Referring Physician:  Admit Date: 06/03/2016  Reason for consultation:  Christine Callahan is a 40 y.o. female with a h/o Diabetes, hepatitis C, and hypertension who presented to the emergency room with chest pain. She says the pain began while she was sitting in a car. As a dull aching sensation that radiated to her left shoulder. The pain lasted for approximately 1 hour. EKG at the time showed sinus rhythm with chronic T-wave inversions. She was given nitroglycerin and aspirin which potentially improved the pain. She has had chest pain in the past which has occurred at rest. She has never had chest pain when she exerts herself.  Today, she denies symptoms of palpitations, chest pain, shortness of breath, orthopnea, PND, lower extremity edema, dizziness, presyncope, syncope, or neurologic sequela. The patient is tolerating medications without difficulties and is otherwise without complaint today.   Past Medical History:  Diagnosis Date  . Diabetes mellitus without complication (HCC)   . Genital herpes 2013  . GERD (gastroesophageal reflux disease)   . Gonorrhea 2015  . Hepatitis C 2016  . Hypertension   . Ovarian cyst    Past Surgical History:  Procedure Laterality Date  . ABDOMINAL HYSTERECTOMY    . CHOLECYSTECTOMY      . carvedilol  12.5 mg Oral Once  . carvedilol  12.5 mg Oral BID WC  . cloNIDine  0.2 mg Oral BID  . enoxaparin (LOVENOX) injection  40 mg Subcutaneous Q24H  . gabapentin  600 mg Oral BID  . hydrochlorothiazide  25 mg Oral Daily  . insulin aspart  0-9 Units Subcutaneous TID WC  . lisinopril  20 mg Oral Daily  . nicotine  21 mg Transdermal Daily     No Known Allergies  Social History   Social History  . Marital status: Single    Spouse name: N/A  . Number of children: N/A  . Years of education: N/A   Occupational History  . Not on file.   Social History Main  Topics  . Smoking status: Current Every Day Smoker    Packs/day: 0.50    Years: 20.00    Types: Cigarettes  . Smokeless tobacco: Never Used  . Alcohol use Yes     Comment: occasional  . Drug use: No  . Sexual activity: Not on file   Other Topics Concern  . Not on file   Social History Narrative  . No narrative on file    Family History  Problem Relation Age of Onset  . Lupus Mother   . Seizures Father     ROS- All systems are reviewed and negative except as per the HPI above  Physical Exam: Telemetry: Vitals:   06/04/16 0100 06/04/16 0125 06/04/16 0219 06/04/16 0529  BP: 140/77 (!) 155/104 113/73 (!) 138/93  Pulse: 77  80 60  Resp:   18 18  Temp:   98.2 F (36.8 C) 98.4 F (36.9 C)  TempSrc:   Oral Oral  SpO2: 96%  99% 100%  Weight:   269 lb (122 kg)   Height:   5\' 9"  (1.753 m)     GEN- The patient is well appearing, alert and oriented x 3 today.   Head- normocephalic, atraumatic Eyes-  Sclera clear, conjunctiva pink Ears- hearing intact Oropharynx- clear Neck- supple, no JVP Lymph- no cervical lymphadenopathy Lungs- Clear to ausculation bilaterally, normal work of breathing Heart- Regular rate and  rhythm, no murmurs, rubs or gallops, PMI not laterally displaced GI- soft, NT, ND, + BS Extremities- no clubbing, cyanosis, or edema MS- no significant deformity or atrophy Skin- no rash or lesion Psych- euthymic mood, full affect Neuro- strength and sensation are intact  EKG: Sinus rhythm, diffuse T-wave inversions (old), episodes of sinus tachycardia on telemetry  Labs:   Lab Results  Component Value Date   WBC 9.3 06/04/2016   HGB 14.5 06/04/2016   HCT 43.4 06/04/2016   MCV 87.1 06/04/2016   PLT 215 06/04/2016    Recent Labs Lab 06/03/16 2245 06/04/16 0336  NA 139  --   K 3.3*  --   CL 105  --   CO2 26  --   BUN 17  --   CREATININE 1.07* 1.11*  CALCIUM 9.4  --   GLUCOSE 96  --    Lab Results  Component Value Date   TROPONINI <0.03  06/04/2016   No results found for: CHOL No results found for: HDL No results found for: LDLCALC No results found for: TRIG No results found for: CHOLHDL No results found for: LDLDIRECT    Radiology:No acute abnormality noted.  Echo: pending  ASSESSMENT AND PLAN:   1. Chest pain: Today, it appears that her chest pain is quite atypical. It occurred at rest, lasted for approximately 1 hour, and she did not have EKG changes, or troponin elevation. Her pain was possibly relieved with nitroglycerin. She has had similar chest discomfort in the past, but never when she is exerting herself. Due to that, Arah Aro plan for outpatient nuclear stress testing. If her echo is abnormal, she would benefit from inpatient stress testing.  2. Palpitations: Seen sinus tachycardia on the monitor. Would increase her Coreg to 25 mg.   Allissa Albright Jorja LoaMartin Henrik Orihuela, MD 06/04/2016  11:19 AM

## 2016-06-04 NOTE — Progress Notes (Addendum)
Received critical troponin from lab of 0.03.  Notified attending via amion.  Await further orders.  Will cont to monitor.

## 2016-06-05 LAB — HEMOGLOBIN A1C
Hgb A1c MFr Bld: 6.4 % — ABNORMAL HIGH (ref 4.8–5.6)
Mean Plasma Glucose: 137 mg/dL

## 2016-06-08 MED FILL — CARVEDILOL 12.5 MG TABLET: 12.5 | 8 days supply | Qty: 30 | Fill #0

## 2016-06-09 ENCOUNTER — Telehealth (HOSPITAL_COMMUNITY): Payer: Self-pay | Admitting: *Deleted

## 2016-06-09 ENCOUNTER — Telehealth (HOSPITAL_COMMUNITY): Payer: Self-pay | Admitting: Physician Assistant

## 2016-06-09 NOTE — Telephone Encounter (Signed)
Called pt and left a detailed message of the instructions she is to follow for lexiscan on 12/4 and 12/5. I also left the address to the facility.

## 2016-06-09 NOTE — Telephone Encounter (Signed)
Patient given detailed instructions per Myocardial Perfusion Study Information Sheet for the test on 06/12/16 at 12:45. Patient notified to arrive 15 minutes early and that it is imperative to arrive on time for appointment to keep from having the test rescheduled.  If you need to cancel or reschedule your appointment, please call the office within 24 hours of your appointment. Failure to do so may result in a cancellation of your appointment, and a $50 no show fee. Patient verbalized understanding.Daneil DolinSharon S Brooks

## 2016-06-12 ENCOUNTER — Encounter (INDEPENDENT_AMBULATORY_CARE_PROVIDER_SITE_OTHER): Payer: Self-pay

## 2016-06-12 ENCOUNTER — Ambulatory Visit (HOSPITAL_COMMUNITY): Payer: Self-pay | Attending: Cardiology

## 2016-06-12 DIAGNOSIS — R079 Chest pain, unspecified: Secondary | ICD-10-CM

## 2016-06-12 DIAGNOSIS — I10 Essential (primary) hypertension: Secondary | ICD-10-CM | POA: Insufficient documentation

## 2016-06-12 MED ORDER — REGADENOSON 0.4 MG/5ML IV SOLN
0.4000 mg | Freq: Once | INTRAVENOUS | Status: AC
  Administered 2016-06-12: 0.4 mg via INTRAVENOUS

## 2016-06-12 MED ORDER — TECHNETIUM TC 99M TETROFOSMIN IV KIT
33.0000 | PACK | Freq: Once | INTRAVENOUS | Status: AC | PRN
  Administered 2016-06-12: 33 via INTRAVENOUS
  Filled 2016-06-12: qty 33

## 2016-06-13 ENCOUNTER — Ambulatory Visit (HOSPITAL_COMMUNITY): Payer: Medicaid Other

## 2016-06-16 MED FILL — cloNIDine HCL 0.2 MG TABS: 0.2 | 30 days supply | Qty: 60 | Fill #1

## 2016-06-19 ENCOUNTER — Ambulatory Visit (HOSPITAL_COMMUNITY): Payer: Medicaid Other | Attending: Cardiology

## 2016-06-19 LAB — MYOCARDIAL PERFUSION IMAGING
CHL CUP NUCLEAR SDS: 0
CHL CUP NUCLEAR SRS: 4
CHL CUP NUCLEAR SSS: 2
CSEPPHR: 93 {beats}/min
LVDIAVOL: 132 mL (ref 46–106)
LVSYSVOL: 67 mL
RATE: 0.27
Rest HR: 69 {beats}/min
TID: 1.04

## 2016-06-19 MED ORDER — TECHNETIUM TC 99M TETROFOSMIN IV KIT
32.5000 | PACK | Freq: Once | INTRAVENOUS | Status: AC | PRN
  Administered 2016-06-19: 32.5 via INTRAVENOUS
  Filled 2016-06-19: qty 33

## 2016-06-20 ENCOUNTER — Telehealth: Payer: Self-pay | Admitting: Family Medicine

## 2016-06-20 NOTE — Telephone Encounter (Signed)
Pt returned call to Garner GavelJennifer W.  -pls call [phone]

## 2016-06-20 NOTE — Telephone Encounter (Signed)
Returned pts call and went over her stress test results. She verbalized appreciation and understanding.

## 2016-06-23 MED FILL — HYDROCHLOROTHIAZIDE 25 MG T: 25 | 30 days supply | Qty: 30 | Fill #1

## 2016-08-07 MED FILL — CARVEDILOL 12.5 MG TABLET: 12.5 | 30 days supply | Qty: 60 | Fill #0

## 2016-09-10 ENCOUNTER — Encounter (HOSPITAL_BASED_OUTPATIENT_CLINIC_OR_DEPARTMENT_OTHER): Payer: Self-pay | Admitting: Emergency Medicine

## 2016-09-10 ENCOUNTER — Emergency Department (HOSPITAL_BASED_OUTPATIENT_CLINIC_OR_DEPARTMENT_OTHER)
Admission: EM | Admit: 2016-09-10 | Discharge: 2016-09-11 | Disposition: A | Discharge status: Home / Self Care | Payer: Medicaid Other | Attending: Emergency Medicine | Admitting: Emergency Medicine

## 2016-09-10 DIAGNOSIS — E119 Type 2 diabetes mellitus without complications: Secondary | ICD-10-CM | POA: Insufficient documentation

## 2016-09-10 DIAGNOSIS — F17210 Nicotine dependence, cigarettes, uncomplicated: Secondary | ICD-10-CM | POA: Insufficient documentation

## 2016-09-10 DIAGNOSIS — L0291 Cutaneous abscess, unspecified: Secondary | ICD-10-CM

## 2016-09-10 DIAGNOSIS — L02211 Cutaneous abscess of abdominal wall: Secondary | ICD-10-CM | POA: Insufficient documentation

## 2016-09-10 DIAGNOSIS — Z794 Long term (current) use of insulin: Secondary | ICD-10-CM | POA: Insufficient documentation

## 2016-09-10 DIAGNOSIS — Z79899 Other long term (current) drug therapy: Secondary | ICD-10-CM | POA: Insufficient documentation

## 2016-09-10 DIAGNOSIS — I10 Essential (primary) hypertension: Secondary | ICD-10-CM | POA: Insufficient documentation

## 2016-09-10 LAB — CBG MONITORING, ED: Glucose-Capillary: 102 mg/dL — ABNORMAL HIGH (ref 65–99)

## 2016-09-10 MED ORDER — OXYCODONE-ACETAMINOPHEN 5-325 MG PO TABS
1.0000 | ORAL_TABLET | Freq: Once | ORAL | Status: AC
  Administered 2016-09-10: 1 via ORAL
  Filled 2016-09-10: qty 1

## 2016-09-10 MED ORDER — LIDOCAINE HCL (PF) 1 % IJ SOLN
5.0000 mL | Freq: Once | INTRAMUSCULAR | Status: AC
  Administered 2016-09-10: 5 mL
  Filled 2016-09-10: qty 5

## 2016-09-10 MED ORDER — IBUPROFEN 800 MG PO TABS
800.0000 mg | ORAL_TABLET | Freq: Once | ORAL | Status: AC
  Administered 2016-09-10: 800 mg via ORAL
  Filled 2016-09-10: qty 1

## 2016-09-10 MED ORDER — CEPHALEXIN 250 MG PO CAPS
500.0000 mg | ORAL_CAPSULE | Freq: Once | ORAL | Status: AC
  Administered 2016-09-11: 500 mg via ORAL
  Filled 2016-09-10: qty 2

## 2016-09-10 NOTE — ED Notes (Signed)
H/o DM, here for "non-healing abscess to L axillary side/abd", first noticed on 2/20, started draining on its own on 2/27, [facility]  started on abx on 2/27, reports not improving after 5d of abx. C/o pain. (denies: fever). Alert, NAD, calm, interactive, resps e/u, speaking in clear complete sentences, no dyspnea noted, skin W&D. [facility]

## 2016-09-10 NOTE — ED Provider Notes (Signed)
[facility] Provider Note   CSN: [id number] Arrival date & time: 09/10/16  2051  By signing my name below, I, [name], attest that this documentation has been prepared under the direction and in the presence of Buel Ream, PA-C. Electronically Signed: [name], ED Scribe. 09/10/16. 10:25 PM.  History   Chief Complaint Chief Complaint  Patient presents with  . Abscess   The history is provided by the patient. No language interpreter was used.   HPI Comments: Christine Callahan Christine Callahan is a 41 y.o. female with PMHx of DM, GERD and HTN who presents to the Emergency Department complaining of an abscess to the left side of her torso for over a week. Area is still draining and pt states an incision and drainage was not performed by [facility]. She has been taking prescribed Bactrim with no relief to symptoms. She reports associated fever (Tmax 102F) a few days ago, but none since. Pt denies hx of prior abscesses. Denies chest pain, shortness of breath, abdominal pain, hematuria, dysuria, or any other complains at this time.   Past Medical History:  Diagnosis Date  . Diabetes mellitus without complication (HCC)   . Genital herpes 2013  . GERD (gastroesophageal reflux disease)   . Gonorrhea 2015  . Hepatitis C 2016  . Hypertension   . Ovarian cyst     Patient Active Problem List   Diagnosis Date Noted  . Chest pain 06/04/2016  . Essential hypertension 06/04/2016  . Diabetes mellitus due to underlying condition (HCC) 06/04/2016  . Heart palpitations 06/04/2016  . Morbid obesity (HCC) 06/04/2016  . Tobacco abuse 06/04/2016  . Ingrown nail 09/19/2013  . Diabetic neuropathy (HCC) 09/19/2013  . Equinus deformity of foot, acquired 09/19/2013  . Plantar keratosis, acquired 09/19/2013  . Pain in lower limb 09/19/2013  . Lumbago 09/08/2011  . Pain in joint, ankle and foot 09/08/2011  . Sciatica of left side 09/08/2011    Past Surgical History:  Procedure  Laterality Date  . ABDOMINAL HYSTERECTOMY    . CHOLECYSTECTOMY      OB History    No data available       Home Medications    Prior to Admission medications   Medication Sig Start Date End Date Taking? Authorizing Provider  carvedilol (COREG) 12.5 MG tablet Take 2 tablets (25 mg total) by mouth 2 (two) times daily. 06/04/16  Yes Nishant Dhungel, MD  cloNIDine (CATAPRES) 0.2 MG tablet Take 1 tablet (0.2 mg total) by mouth 2 (two) times daily. 02/10/16  Yes Will Jorja Loa, MD  gabapentin (NEURONTIN) 400 MG capsule Take 400 mg by mouth 2 (two) times daily as needed (pain).   Yes Historical Provider, MD  hydrochlorothiazide (HYDRODIURIL) 25 MG tablet Take 25 mg by mouth daily. 05/22/16  Yes Historical Provider, MD  metFORMIN (GLUCOPHAGE) 1000 MG tablet Take 1,000 mg by mouth 2 (two) times daily with a meal.   Yes Historical Provider, MD  Multiple Vitamin (MULTIVITAMIN WITH MINERALS) TABS tablet Take 1 tablet by mouth daily.   Yes Historical Provider, MD  cephALEXin (KEFLEX) 500 MG capsule Take 1 capsule (500 mg total) by mouth 4 (four) times daily. 09/11/16   Emi Holes, PA-C  HYDROcodone-acetaminophen (NORCO) 5-325 MG tablet Take 1-2 tablets by mouth every 6 (six) hours as needed (for pain). Patient not taking: Reported on 06/04/2016 03/20/16   Paula Libra, MD  ibuprofen (ADVIL,MOTRIN) 800 MG tablet Take 1 tablet (800 mg total) by mouth 3 (three) times daily.  09/11/16   Waylan BogaAlexandra M Gage Weant, PA-C  lisinopril (PRINIVIL,ZESTRIL) 20 MG tablet Take 1 tablet (20 mg total) by mouth daily. 05/20/16 08/18/16  Eyvonne MechanicJeffrey Hedges, PA-C  nicotine (NICODERM CQ - DOSED IN MG/24 HOURS) 21 mg/24hr patch Place 1 patch (21 mg total) onto the skin daily. 06/05/16   Nishant Dhungel, MD  potassium chloride SA (K-DUR,KLOR-CON) 20 MEQ tablet Take 1 tablet (20 mEq total) by mouth daily. 06/04/16   Nishant Dhungel, MD  traMADol (ULTRAM) 50 MG tablet Take 1 tablet (50 mg total) by mouth every 6 (six) hours as  needed. Patient not taking: Reported on 06/04/2016 05/20/16   Eyvonne MechanicJeffrey Hedges, PA-C    Family History Family History  Problem Relation Age of Onset  . Lupus Mother   . Seizures Father     Social History Social History  Substance Use Topics  . Smoking status: Current Every Day Smoker    Packs/day: 0.50    Years: 20.00    Types: Cigarettes  . Smokeless tobacco: Never Used  . Alcohol use Yes     Comment: occasional     Allergies   Patient has no known allergies.   Review of Systems Review of Systems  Constitutional: Positive for fever. Negative for chills.  HENT: Negative for facial swelling and sore throat.   Respiratory: Negative for shortness of breath.   Cardiovascular: Negative for chest pain.  Gastrointestinal: Negative for abdominal pain, nausea and vomiting.  Genitourinary: Negative for dysuria and hematuria.  Musculoskeletal: Negative for back pain.  Skin: Positive for wound. Negative for rash.  Neurological: Negative for headaches.  Psychiatric/Behavioral: The patient is not nervous/anxious.      Physical Exam Updated Vital Signs BP 123/82 (BP Location: Left Arm)   Pulse 63   Temp 98 F (36.7 C) (Oral)   Resp 20   Ht 5\' 10"  (1.778 m)   Wt 122.9 kg   SpO2 98%   BMI 38.88 kg/m   Physical Exam  Constitutional: She appears well-developed and well-nourished. No distress.  HENT:  Head: Normocephalic and atraumatic.  Mouth/Throat: Oropharynx is clear and moist. No oropharyngeal exudate.  Eyes: Conjunctivae are normal. Pupils are equal, round, and reactive to light. Right eye exhibits no discharge. Left eye exhibits no discharge. No scleral icterus.  Neck: Normal range of motion. Neck supple. No thyromegaly present.  Cardiovascular: Normal rate, regular rhythm, normal heart sounds and intact distal pulses.  Exam reveals no gallop and no friction rub.   No murmur heard. Pulmonary/Chest: Effort normal and breath sounds normal. No stridor. No respiratory  distress. She has no wheezes. She has no rales.  Abdominal: Soft. Bowel sounds are normal. She exhibits no distension. There is no tenderness. There is no rebound and no guarding.    Musculoskeletal: She exhibits no edema.  Lymphadenopathy:    She has no cervical adenopathy.  Neurological: She is alert. Coordination normal.  Skin: Skin is warm and dry. No rash noted. She is not diaphoretic. No pallor.  Psychiatric: She has a normal mood and affect.  Nursing note and vitals reviewed.  ED Treatments / Results  DIAGNOSTIC STUDIES: Oxygen Saturation is 98% on RA, normal by my interpretation.    COORDINATION OF CARE: 10:10 PM Discussed treatment plan with pt at bedside which includes Incision and drainage and pt agreed to plan.  Labs (all labs ordered are listed, but only abnormal results are displayed) Labs Reviewed  CBG MONITORING, ED - Abnormal; Notable for the following:  Result Value   Glucose-Capillary 102 (*)    All other components within normal limits  AEROBIC CULTURE (SUPERFICIAL SPECIMEN)    EKG  EKG Interpretation None       Radiology No results found.  Procedures Procedures (including critical care time)  INCISION AND DRAINAGE Performed by: Emi Holes Consent: Verbal consent obtained. Risks and benefits: risks, benefits and alternatives were discussed Type: abscess  Body area: L side abdomen/flank  Anesthesia: local infiltration  Incision was made with a scalpel.  Local anesthetic: lidocaine 1% w/o epinephrine  Anesthetic total: 5 ml  Complexity: complex Patient could not tolerate blunt dissection to break up loculations; I discussed with patient that this may inhibit complete and successful healing of the wound. She understood, but could not continue the procedure. I washed the wound out with copious amounts of normal saline, which the patient tolerated well.   Drainage: purulent  Drainage amount: moderate  Packing material: not  large enough to pack  Patient tolerance: Patient tolerated the procedure well with no immediate complications.   Medications Ordered in ED Medications  lidocaine (PF) (XYLOCAINE) 1 % injection 5 mL (not administered)  cephALEXin (KEFLEX) capsule 500 mg (not administered)  ibuprofen (ADVIL,MOTRIN) tablet 800 mg (800 mg Oral Given 09/10/16 2215)  oxyCODONE-acetaminophen (PERCOCET/ROXICET) 5-325 MG per tablet 1 tablet (1 tablet Oral Given 09/10/16 2215)     Initial Impression / Assessment and Plan / ED Course  I have reviewed the triage vital signs and the nursing notes.  Pertinent labs & imaging results that were available during my care of the patient were reviewed by me and considered in my medical decision making (see chart for details).     Patient with recurrent skin abscess despite treatment with Bactrim. Incision and drainage performed in the ED today.  Abscess was not large enough to warrant packing or drain placement. Wound recheck in 2 days. Supportive care and return precautions discussed.  Pt sent home with Keflex in addition to Bactrim. Wound culture sent. Patient understands and agrees with plan. Patient vitals stable throughout ED course and discharged in satisfactory condition.   Final Clinical Impressions(s) / ED Diagnoses   Final diagnoses:  Abscess    New Prescriptions New Prescriptions   CEPHALEXIN (KEFLEX) 500 MG CAPSULE    Take 1 capsule (500 mg total) by mouth 4 (four) times daily.   IBUPROFEN (ADVIL,MOTRIN) 800 MG TABLET    Take 1 tablet (800 mg total) by mouth 3 (three) times daily.  I personally performed the services described in this documentation, which was scribed in my presence. The recorded information has been reviewed and is accurate.      [address] PA-C 09/11/16 1610    Cathren Laine, MD 09/11/16 [phone]

## 2016-09-10 NOTE — ED Notes (Signed)
Sitting on bed, talking on phone, NAD, calm, interactive, resps e/u, no dyspnea noted, "feel better".

## 2016-09-10 NOTE — ED Notes (Signed)
ED PA at [facility] 

## 2016-09-10 NOTE — ED Triage Notes (Signed)
Abscess to left side x 5 days. Taking Bactrim but not getting any better. Draining pus

## 2016-09-11 MED ORDER — IBUPROFEN 800 MG PO TABS: 800.0000 mg | ORAL_TABLET | Freq: Three times a day (TID) | ORAL | 0 refills | Status: AC

## 2016-09-11 MED ORDER — CEPHALEXIN 500 MG PO CAPS: 500.0000 mg | ORAL_CAPSULE | Freq: Four times a day (QID) | ORAL | 0 refills | Status: DC

## 2016-09-11 NOTE — Discharge Instructions (Signed)
Medications: Keflex, ibuprofen  Treatment: Take Keflex 4 times daily until finished. Continue taking Bactrim until finished. Take ibuprofen every 8 hours as needed for pain. You can alternate with Tylenol as prescribed over-the-counter. Wash your wound with warm soapy water once daily and apply clean dressing. Keep wound dry until tomorrow morning.  Follow-up: Please return to the emergency department in 2 days for wound recheck, or sooner if you develop any fevers, increasing pain, redness, swelling, red streaking from the area.

## 2016-09-12 ENCOUNTER — Encounter (HOSPITAL_BASED_OUTPATIENT_CLINIC_OR_DEPARTMENT_OTHER): Payer: Self-pay

## 2016-09-12 ENCOUNTER — Emergency Department (HOSPITAL_BASED_OUTPATIENT_CLINIC_OR_DEPARTMENT_OTHER)
Admission: EM | Admit: 2016-09-12 | Discharge: 2016-09-12 | Disposition: A | Discharge status: Home / Self Care | Payer: Medicaid Other | Attending: Emergency Medicine | Admitting: Emergency Medicine

## 2016-09-12 DIAGNOSIS — Z7984 Long term (current) use of oral hypoglycemic drugs: Secondary | ICD-10-CM | POA: Insufficient documentation

## 2016-09-12 DIAGNOSIS — F17210 Nicotine dependence, cigarettes, uncomplicated: Secondary | ICD-10-CM | POA: Insufficient documentation

## 2016-09-12 DIAGNOSIS — Z5189 Encounter for other specified aftercare: Secondary | ICD-10-CM

## 2016-09-12 DIAGNOSIS — I10 Essential (primary) hypertension: Secondary | ICD-10-CM | POA: Insufficient documentation

## 2016-09-12 DIAGNOSIS — Z4801 Encounter for change or removal of surgical wound dressing: Secondary | ICD-10-CM | POA: Insufficient documentation

## 2016-09-12 DIAGNOSIS — E119 Type 2 diabetes mellitus without complications: Secondary | ICD-10-CM | POA: Insufficient documentation

## 2016-09-12 NOTE — ED Provider Notes (Signed)
[facility]   CSN: [id number] Arrival date & time: 09/12/16  1110     History   Chief Complaint Chief Complaint  Patient presents with  . Follow-up    HPI Christine Callahan is a 41 y.o. female with history of diabetes who presents for wound check following abscess and incision and drainage. Patient reports improved pain and no fevers, increasing redness, swelling, or copious drainage. Patient has been keeping the wound clean and changing dressings. Patient continues to take Bactrim and Keflex.  HPI  Past Medical History:  Diagnosis Date  . Diabetes mellitus without complication (HCC)   . Genital herpes 2013  . GERD (gastroesophageal reflux disease)   . Gonorrhea 2015  . Hepatitis C 2016  . Hypertension   . Ovarian cyst     Patient Active Problem List   Diagnosis Date Noted  . Chest pain 06/04/2016  . Essential hypertension 06/04/2016  . Diabetes mellitus due to underlying condition (HCC) 06/04/2016  . Heart palpitations 06/04/2016  . Morbid obesity (HCC) 06/04/2016  . Tobacco abuse 06/04/2016  . Ingrown nail 09/19/2013  . Diabetic neuropathy (HCC) 09/19/2013  . Equinus deformity of foot, acquired 09/19/2013  . Plantar keratosis, acquired 09/19/2013  . Pain in lower limb 09/19/2013  . Lumbago 09/08/2011  . Pain in joint, ankle and foot 09/08/2011  . Sciatica of left side 09/08/2011    Past Surgical History:  Procedure Laterality Date  . ABDOMINAL HYSTERECTOMY    . CHOLECYSTECTOMY      OB History    No data available       Home Medications    Prior to Admission medications   Medication Sig Start Date End Date Taking? Authorizing Provider  carvedilol (COREG) 12.5 MG tablet Take 2 tablets (25 mg total) by mouth 2 (two) times daily. 06/04/16   Nishant Dhungel, MD  cephALEXin (KEFLEX) 500 MG capsule Take 1 capsule (500 mg total) by mouth 4 (four) times daily. 09/11/16   Emi Holes, PA-C  cloNIDine (CATAPRES) 0.2 MG tablet Take 1  tablet (0.2 mg total) by mouth 2 (two) times daily. 02/10/16   Will Jorja Loa, MD  gabapentin (NEURONTIN) 400 MG capsule Take 400 mg by mouth 2 (two) times daily as needed (pain).    Historical Provider, MD  hydrochlorothiazide (HYDRODIURIL) 25 MG tablet Take 25 mg by mouth daily. 05/22/16   Historical Provider, MD  HYDROcodone-acetaminophen (NORCO) 5-325 MG tablet Take 1-2 tablets by mouth every 6 (six) hours as needed (for pain). Patient not taking: Reported on 06/04/2016 03/20/16   Paula Libra, MD  ibuprofen (ADVIL,MOTRIN) 800 MG tablet Take 1 tablet (800 mg total) by mouth 3 (three) times daily. 09/11/16   Waylan Boga Trichelle Lehan, PA-C  lisinopril (PRINIVIL,ZESTRIL) 20 MG tablet Take 1 tablet (20 mg total) by mouth daily. 05/20/16 08/18/16  Eyvonne Mechanic, PA-C  metFORMIN (GLUCOPHAGE) 1000 MG tablet Take 1,000 mg by mouth 2 (two) times daily with a meal.    Historical Provider, MD  Multiple Vitamin (MULTIVITAMIN WITH MINERALS) TABS tablet Take 1 tablet by mouth daily.    Historical Provider, MD  nicotine (NICODERM CQ - DOSED IN MG/24 HOURS) 21 mg/24hr patch Place 1 patch (21 mg total) onto the skin daily. 06/05/16   Nishant Dhungel, MD  potassium chloride SA (K-DUR,KLOR-CON) 20 MEQ tablet Take 1 tablet (20 mEq total) by mouth daily. 06/04/16   Nishant Dhungel, MD  traMADol (ULTRAM) 50 MG tablet Take 1 tablet (50 mg total) by mouth every  6 (six) hours as needed. Patient not taking: Reported on 06/04/2016 05/20/16   Eyvonne MechanicJeffrey Hedges, PA-C    Family History Family History  Problem Relation Age of Onset  . Lupus Mother   . Seizures Father     Social History Social History  Substance Use Topics  . Smoking status: Current Every Day Smoker    Packs/day: 0.50    Years: 20.00    Types: Cigarettes  . Smokeless tobacco: Never Used  . Alcohol use Yes     Comment: occasional     Allergies   Patient has no known allergies.   Review of Systems Review of Systems  Constitutional: Negative for fever.    Gastrointestinal: Negative for nausea and vomiting.  Skin: Positive for wound.     Physical Exam Updated Vital Signs BP 185/98 (BP Location: Left Arm)   Pulse 67   Temp 98.5 F (36.9 C) (Oral)   Resp 18   Ht 5\' 10"  (1.778 m)   Wt 122.9 kg   SpO2 98%   BMI 38.88 kg/m   Physical Exam  Constitutional: She appears well-developed and well-nourished. No distress.  HENT:  Head: Normocephalic and atraumatic.  Mouth/Throat: Oropharynx is clear and moist. No oropharyngeal exudate.  Eyes: Conjunctivae are normal. Pupils are equal, round, and reactive to light. Right eye exhibits no discharge. Left eye exhibits no discharge. No scleral icterus.  Neck: Normal range of motion. Neck supple. No thyromegaly present.  Cardiovascular: Normal rate, regular rhythm, normal heart sounds and intact distal pulses.  Exam reveals no gallop and no friction rub.   No murmur heard. Pulmonary/Chest: Effort normal and breath sounds normal. No stridor. No respiratory distress. She has no wheezes. She has no rales.  Abdominal: Soft. Bowel sounds are normal. She exhibits no distension. There is no tenderness. There is no rebound and no guarding.  Musculoskeletal: She exhibits no edema.  Lymphadenopathy:    She has no cervical adenopathy.  Neurological: She is alert. Coordination normal.  Skin: Skin is warm and dry. No rash noted. She is not diaphoretic. No pallor.  Well-healing 1.5 cm wound from incision and drainage, no redness or erythema; area induration has reduced from last visit (~ 4 cm diameter)  Psychiatric: She has a normal mood and affect.  Nursing note and vitals reviewed.    ED Treatments / Results  Labs (all labs ordered are listed, but only abnormal results are displayed) Labs Reviewed - No data to display  EKG  EKG Interpretation None       Radiology No results found.  Procedures Procedures (including critical care time)  Medications Ordered in ED Medications - No data to  display   Initial Impression / Assessment and Plan / ED Course  I have reviewed the triage vital signs and the nursing notes.  Pertinent labs & imaging results that were available during my care of the patient were reviewed by me and considered in my medical decision making (see chart for details).     Patient returns for check of  recent incision and drainage. The region appears to be well-healing and infection appears to be resolving. Patient symptoms improved from prior visit. Afebrile and hemodynamically stable. Pt is instructed to continue with home care and complete antibiotics. Pt has a good understanding of return precautions and is safe for discharge at this time. Patient has not taken her hypertension medications today and reports she will go home to take them now.   Final Clinical Impressions(s) / ED Diagnoses  Final diagnoses:  Wound check, abscess    New Prescriptions New Prescriptions   No medications on file     Emi Holes, PA-C 09/12/16 1143    Jerelyn Scott, MD 09/12/16 1155

## 2016-09-12 NOTE — ED Triage Notes (Signed)
Recheck of abd abscess-NAD-steady gait

## 2016-09-12 NOTE — Discharge Instructions (Signed)
Treatment: Continue wound care as you have been. You can add bacitracin or triple antibiotic ointment with your wound changes. Wash with warm soapy water once to twice daily. Continue taking your antibiotics as prescribed and make sure to finish all of the medication.  Follow-up: Please return to the emergency department if you develop any fevers, increasing pain, redness, swelling, increased drainage, red streaking from the area. If your symptoms are continuing and just not totally resolving, please see her primary care provider or return to emergency department.

## 2016-09-12 NOTE — ED Notes (Signed)
ED Provider at bedside. 

## 2016-09-13 LAB — AEROBIC CULTURE W GRAM STAIN (SUPERFICIAL SPECIMEN)

## 2016-09-13 LAB — AEROBIC CULTURE  (SUPERFICIAL SPECIMEN)

## 2016-09-14 ENCOUNTER — Telehealth: Payer: Self-pay | Admitting: *Deleted

## 2016-09-14 NOTE — Telephone Encounter (Signed)
Post ED Visit - Positive Culture Follow-up  Culture report reviewed by antimicrobial stewardship pharmacist:  []  Christine Callahan, Pharm.D. []  Christine Callahan, Pharm.D., BCPS []  Christine Callahan, Pharm.D. []  Christine Callahan, Pharm.D., BCPS []  Elk[place]h Callahan, [place]., BCPS, AAHIVP [x]  Christine Callahan, Pharm.D., BCPS, AAHIVP []  Tennis Mustassie Callahan, Pharm.D. []  Sherle Christine Callahan, [place].D.  Positive aerobic culture Treated with cephalexin, organism sensitive to the same and no further patient follow-up is required at this time.  Christine Callahan, [facility]ahan [facility] 09/14/2016, 11:44 AM

## 2016-09-15 MED FILL — cloNIDine HCL 0.2 MG TABS: 0.2 | 30 days supply | Qty: 60 | Fill #0

## 2016-09-15 MED FILL — CARVEDILOL 12.5 MG TABLET: 12.5 | 30 days supply | Qty: 60 | Fill #1

## 2016-10-16 ENCOUNTER — Emergency Department (HOSPITAL_BASED_OUTPATIENT_CLINIC_OR_DEPARTMENT_OTHER)
Admission: EM | Admit: 2016-10-16 | Discharge: 2016-10-16 | Discharge status: Against Medical Advice | Payer: Medicaid Other | Attending: Emergency Medicine | Admitting: Emergency Medicine

## 2016-10-16 ENCOUNTER — Encounter (HOSPITAL_BASED_OUTPATIENT_CLINIC_OR_DEPARTMENT_OTHER): Payer: Self-pay | Admitting: *Deleted

## 2016-10-16 DIAGNOSIS — R300 Dysuria: Secondary | ICD-10-CM | POA: Insufficient documentation

## 2016-10-16 DIAGNOSIS — Z79899 Other long term (current) drug therapy: Secondary | ICD-10-CM | POA: Insufficient documentation

## 2016-10-16 DIAGNOSIS — Z7984 Long term (current) use of oral hypoglycemic drugs: Secondary | ICD-10-CM | POA: Insufficient documentation

## 2016-10-16 DIAGNOSIS — F17210 Nicotine dependence, cigarettes, uncomplicated: Secondary | ICD-10-CM | POA: Insufficient documentation

## 2016-10-16 DIAGNOSIS — N898 Other specified noninflammatory disorders of vagina: Secondary | ICD-10-CM

## 2016-10-16 DIAGNOSIS — I10 Essential (primary) hypertension: Secondary | ICD-10-CM | POA: Insufficient documentation

## 2016-10-16 DIAGNOSIS — E119 Type 2 diabetes mellitus without complications: Secondary | ICD-10-CM | POA: Insufficient documentation

## 2016-10-16 LAB — URINALYSIS, ROUTINE W REFLEX MICROSCOPIC
Bilirubin Urine: NEGATIVE
GLUCOSE, UA: NEGATIVE mg/dL
Hgb urine dipstick: NEGATIVE
KETONES UR: NEGATIVE mg/dL
LEUKOCYTES UA: NEGATIVE
Nitrite: NEGATIVE
PH: 5.5 (ref 5.0–8.0)
Protein, ur: NEGATIVE mg/dL
SPECIFIC GRAVITY, URINE: 1.022 (ref 1.005–1.030)

## 2016-10-16 LAB — WET PREP, GENITAL
SPERM: NONE SEEN
TRICH WET PREP: NONE SEEN
YEAST WET PREP: NONE SEEN

## 2016-10-16 LAB — PREGNANCY, URINE: Preg Test, Ur: NEGATIVE

## 2016-10-16 MED ORDER — CEFTRIAXONE SODIUM 250 MG IJ SOLR
250.0000 mg | Freq: Once | INTRAMUSCULAR | Status: AC
  Administered 2016-10-16: 250 mg via INTRAMUSCULAR
  Filled 2016-10-16: qty 250

## 2016-10-16 MED ORDER — AZITHROMYCIN 250 MG PO TABS
1000.0000 mg | ORAL_TABLET | Freq: Once | ORAL | Status: AC
  Administered 2016-10-16: 1000 mg via ORAL
  Filled 2016-10-16: qty 4

## 2016-10-16 NOTE — ED Notes (Signed)
In to assess the patient and introduce self. Patient on phone, and states that she does not want the person the phone to hear what we are talking about. Patient did not get off the phone, so this RN introduced self and let the patient know we are available if needed. Will do secondary assessment at later time. Patient in no distress at this time

## 2016-10-16 NOTE — ED Notes (Signed)
Registration reports pt seen leaving department

## 2016-10-16 NOTE — ED Triage Notes (Signed)
Pt reports 2 days of pelvic pain, denies any discharge or bleeding, states "I been treated for gonorrhea before and it felt like this."

## 2016-10-16 NOTE — ED Provider Notes (Signed)
[facility] Provider Note   CSN: [id number] Arrival date & time: 10/16/16  1637  By signing my name below, I, Christine Callahan, attest that this documentation has been prepared under the direction and in the presence of Rolan Bucco, MD. Electronically Signed: Modena Callahan, Scribe. 10/16/2016. 6:14 PM.  History   Chief Complaint No chief complaint on file.  The history is provided by the patient. No language interpreter was used.   HPI Comments: Christine Callahan is a 41 y.o. female who presents to the Emergency Department complaining of constant moderate lower abdominal pain  that started a few days ago. No modifying factors. She reports associated dysuria. She is currently sexually active. She admits to a hx of partial hysterectomy. Denies any nausea, vomiting, or other complaints at this time.She has a history of gonorrhea and states this feels similar.     PCP: [facility]  Past Medical History:  Diagnosis Date  . Diabetes mellitus without complication (HCC)   . Genital herpes 2013  . GERD (gastroesophageal reflux disease)   . Gonorrhea 2015  . Hepatitis C 2016  . Hypertension   . Ovarian cyst     Patient Active Problem List   Diagnosis Date Noted  . Chest pain 06/04/2016  . Essential hypertension 06/04/2016  . Diabetes mellitus due to underlying condition (HCC) 06/04/2016  . Heart palpitations 06/04/2016  . Morbid obesity (HCC) 06/04/2016  . Tobacco abuse 06/04/2016  . Ingrown nail 09/19/2013  . Diabetic neuropathy (HCC) 09/19/2013  . Equinus deformity of foot, acquired 09/19/2013  . Plantar keratosis, acquired 09/19/2013  . Pain in lower limb 09/19/2013  . Lumbago 09/08/2011  . Pain in joint, ankle and foot 09/08/2011  . Sciatica of left side 09/08/2011    Past Surgical History:  Procedure Laterality Date  . ABDOMINAL HYSTERECTOMY    . CHOLECYSTECTOMY      OB History    No data available       Home Medications     Prior to Admission medications   Medication Sig Start Date End Date Taking? Authorizing Provider  carvedilol (COREG) 12.5 MG tablet Take 2 tablets (25 mg total) by mouth 2 (two) times daily. 06/04/16   Nishant Dhungel, MD  cephALEXin (KEFLEX) 500 MG capsule Take 1 capsule (500 mg total) by mouth 4 (four) times daily. 09/11/16   Emi Holes, PA-C  cloNIDine (CATAPRES) 0.2 MG tablet Take 1 tablet (0.2 mg total) by mouth 2 (two) times daily. 02/10/16   Will Jorja Loa, MD  gabapentin (NEURONTIN) 400 MG capsule Take 400 mg by mouth 2 (two) times daily as needed (pain).    Historical Provider, MD  hydrochlorothiazide (HYDRODIURIL) 25 MG tablet Take 25 mg by mouth daily. 05/22/16   Historical Provider, MD  HYDROcodone-acetaminophen (NORCO) 5-325 MG tablet Take 1-2 tablets by mouth every 6 (six) hours as needed (for pain). Patient not taking: Reported on 06/04/2016 03/20/16   Paula Libra, MD  ibuprofen (ADVIL,MOTRIN) 800 MG tablet Take 1 tablet (800 mg total) by mouth 3 (three) times daily. 09/11/16   Waylan Boga Law, PA-C  lisinopril (PRINIVIL,ZESTRIL) 20 MG tablet Take 1 tablet (20 mg total) by mouth daily. 05/20/16 08/18/16  Eyvonne Mechanic, PA-C  metFORMIN (GLUCOPHAGE) 1000 MG tablet Take 1,000 mg by mouth 2 (two) times daily with a meal.    Historical Provider, MD  Multiple Vitamin (MULTIVITAMIN WITH MINERALS) TABS tablet Take 1 tablet by mouth daily.    Historical Provider, MD  nicotine (NICODERM CQ - DOSED IN MG/24 HOURS) 21 mg/24hr patch Place 1 patch (21 mg total) onto the skin daily. 06/05/16   Nishant Dhungel, MD  potassium chloride SA (K-DUR,KLOR-CON) 20 MEQ tablet Take 1 tablet (20 mEq total) by mouth daily. 06/04/16   Nishant Dhungel, MD  traMADol (ULTRAM) 50 MG tablet Take 1 tablet (50 mg total) by mouth every 6 (six) hours as needed. Patient not taking: Reported on 06/04/2016 05/20/16   Eyvonne Mechanic, PA-C    Family History Family History  Problem Relation Age of Onset  . Lupus  Mother   . Seizures Father     Social History Social History  Substance Use Topics  . Smoking status: Current Every Day Smoker    Packs/day: 0.50    Years: 20.00    Types: Cigarettes  . Smokeless tobacco: Never Used  . Alcohol use Yes     Comment: occasional     Allergies   Patient has no known allergies.   Review of Systems Review of Systems  Constitutional: Negative for chills, diaphoresis, fatigue and fever.  HENT: Negative for congestion, rhinorrhea and sneezing.   Eyes: Negative.   Respiratory: Negative for cough, chest tightness and shortness of breath.   Cardiovascular: Negative for chest pain and leg swelling.  Gastrointestinal: Positive for abdominal pain (Lower). Negative for blood in stool, diarrhea, nausea and vomiting.  Genitourinary: Positive for dysuria. Negative for difficulty urinating, flank pain, frequency and hematuria.  Musculoskeletal: Negative for arthralgias and back pain.  Skin: Negative for rash.  Neurological: Negative for dizziness, speech difficulty, weakness, numbness and headaches.     Physical Exam Updated Vital Signs BP (!) 182/116 (BP Location: Left Arm)   Pulse 74   Temp 98.7 F (37.1 C) (Oral)   Resp 20   Ht  (1.778 m)   Wt 270 lb (122.5 kg)   SpO2 100%   BMI 38.74 kg/m   Physical Exam  Constitutional: She is oriented to person, place, and time. She appears well-developed and well-nourished.  HENT:  Head: Normocephalic and atraumatic.  Eyes: Pupils are equal, round, and reactive to light.  Neck: Normal range of motion. Neck supple.  Cardiovascular: Normal rate, regular rhythm and normal heart sounds.   Pulmonary/Chest: Effort normal and breath sounds normal. No respiratory distress. She has no wheezes. She has no rales. She exhibits no tenderness.  Abdominal: Soft. Bowel sounds are normal. There is no tenderness. There is no rebound and no guarding.  Genitourinary:  Genitourinary Comments: Slight white vaginal  discharge. No tenderness on exam  Musculoskeletal: Normal range of motion. She exhibits no edema.  Lymphadenopathy:    She has no cervical adenopathy.  Neurological: She is alert and oriented to person, place, and time.  Skin: Skin is warm and dry. No rash noted.  Psychiatric: She has a normal mood and affect.     ED Treatments / Results  DIAGNOSTIC STUDIES: Oxygen Saturation is 100% on RA, normal by my interpretation.    COORDINATION OF CARE: 6:17 PM- Pt advised of plan for treatment and pt agrees.  Labs (all labs ordered are listed, but only abnormal results are displayed) Labs Reviewed  WET PREP, GENITAL - Abnormal; Notable for the following:       Result Value   Clue Cells Wet Prep HPF POC PRESENT (*)    WBC, Wet Prep HPF POC MODERATE (*)    All other components within normal limits  URINALYSIS, ROUTINE W REFLEX MICROSCOPIC  PREGNANCY, URINE  RPR  HIV ANTIBODY (ROUTINE TESTING)  GC/CHLAMYDIA PROBE AMP ([place]) NOT AT [facility]    EKG  EKG Interpretation None       Radiology No results found.  Procedures Procedures (including critical care time)  Medications Ordered in ED Medications  cefTRIAXone (ROCEPHIN) injection 250 mg (250 mg Intramuscular Given 10/16/16 1829)  azithromycin (ZITHROMAX) tablet 1,000 mg (1,000 mg Oral Given 10/16/16 1830)     Initial Impression / Assessment and Plan / ED Course  I have reviewed the triage vital signs and the nursing notes.  Pertinent labs & imaging results that were available during my care of the patient were reviewed by me and considered in my medical decision making (see chart for details).    Patient presents with lower abdominal pain and vaginal discharge. She was treated with Rocephin and Zithromax. She was tested for STDs which are pending. She does have clue cells on her wet prep the patient a low from the room prior to getting the remainder of her results or discharge papers.  Final Clinical Impressions(s) / ED  Diagnoses   Final diagnoses:  Dysuria  Vaginal discharge    New Prescriptions New Prescriptions   No medications on file   I personally performed the services described in this documentation, which was scribed in my presence.  The recorded information has been reviewed and considered.     Rolan Bucco, MD 10/16/16 [phone]

## 2016-10-16 NOTE — ED Notes (Signed)
This EMT went to obtain blood, Patient video chatting while obtaining blood on the phone the entire time.

## 2016-10-17 LAB — GC/CHLAMYDIA PROBE AMP (~~LOC~~) NOT AT ARMC
Chlamydia: NEGATIVE
Neisseria Gonorrhea: NEGATIVE

## 2016-10-17 LAB — HIV ANTIBODY (ROUTINE TESTING W REFLEX): HIV Screen 4th Generation wRfx: NONREACTIVE

## 2016-10-17 LAB — RPR: RPR Ser Ql: NONREACTIVE

## 2016-10-19 ENCOUNTER — Telehealth: Payer: Self-pay | Admitting: *Deleted

## 2016-11-27 ENCOUNTER — Encounter (HOSPITAL_BASED_OUTPATIENT_CLINIC_OR_DEPARTMENT_OTHER): Payer: Self-pay | Admitting: Emergency Medicine

## 2016-11-27 ENCOUNTER — Emergency Department (HOSPITAL_BASED_OUTPATIENT_CLINIC_OR_DEPARTMENT_OTHER)
Admission: EM | Admit: 2016-11-27 | Discharge: 2016-11-27 | Disposition: A | Discharge status: Home / Self Care | Payer: Medicaid Other | Attending: Emergency Medicine | Admitting: Emergency Medicine

## 2016-11-27 DIAGNOSIS — I10 Essential (primary) hypertension: Secondary | ICD-10-CM

## 2016-11-27 DIAGNOSIS — N760 Acute vaginitis: Secondary | ICD-10-CM

## 2016-11-27 DIAGNOSIS — F17210 Nicotine dependence, cigarettes, uncomplicated: Secondary | ICD-10-CM | POA: Insufficient documentation

## 2016-11-27 DIAGNOSIS — Z79899 Other long term (current) drug therapy: Secondary | ICD-10-CM | POA: Insufficient documentation

## 2016-11-27 DIAGNOSIS — Z7984 Long term (current) use of oral hypoglycemic drugs: Secondary | ICD-10-CM | POA: Insufficient documentation

## 2016-11-27 DIAGNOSIS — R1032 Left lower quadrant pain: Secondary | ICD-10-CM

## 2016-11-27 DIAGNOSIS — E119 Type 2 diabetes mellitus without complications: Secondary | ICD-10-CM | POA: Insufficient documentation

## 2016-11-27 LAB — WET PREP, GENITAL
Sperm: NONE SEEN
Trich, Wet Prep: NONE SEEN
Yeast Wet Prep HPF POC: NONE SEEN

## 2016-11-27 LAB — URINALYSIS, ROUTINE W REFLEX MICROSCOPIC
Bilirubin Urine: NEGATIVE
GLUCOSE, UA: NEGATIVE mg/dL
HGB URINE DIPSTICK: NEGATIVE
Ketones, ur: NEGATIVE mg/dL
Leukocytes, UA: NEGATIVE
Nitrite: NEGATIVE
Protein, ur: 100 mg/dL — AB
SPECIFIC GRAVITY, URINE: 1.022 (ref 1.005–1.030)
pH: 5.5 (ref 5.0–8.0)

## 2016-11-27 LAB — URINALYSIS, MICROSCOPIC (REFLEX)

## 2016-11-27 LAB — PREGNANCY, URINE: PREG TEST UR: NEGATIVE

## 2016-11-27 MED ORDER — KETOROLAC TROMETHAMINE 30 MG/ML IJ SOLN
30.0000 mg | Freq: Once | INTRAMUSCULAR | Status: AC
  Administered 2016-11-27: 30 mg via INTRAMUSCULAR
  Filled 2016-11-27: qty 1

## 2016-11-27 MED ORDER — METRONIDAZOLE 500 MG PO TABS: 500.0000 mg | ORAL_TABLET | Freq: Two times a day (BID) | ORAL | 0 refills | Status: DC

## 2016-11-27 MED ORDER — NAPROXEN 500 MG PO TABS: 500.0000 mg | ORAL_TABLET | Freq: Two times a day (BID) | ORAL | 0 refills | Status: AC

## 2016-11-27 MED FILL — metroNIDAZOLE 500 MG TABS: 500 | 7 days supply | Qty: 14 | Fill #0

## 2016-11-27 NOTE — ED Notes (Signed)
Dr Lynelle DoctorKnapp in room with pt now.

## 2016-11-27 NOTE — Discharge Instructions (Signed)
Follow up with your primary care doctor or considering seeing a Gynecologist for further evaluation of your symptoms, take the medications as prescribed

## 2016-11-27 NOTE — ED Triage Notes (Signed)
Pt c/o LLQ pain for past month.  States similar episode 6 months ago, seen in ED and told she has ovarian cyst.  Dennie Bibleat states pain went away "for a while" then returned.  Pt report frequency with urination and left lower back pain also.

## 2016-11-27 NOTE — ED Provider Notes (Signed)
[facility]   CSN: [id number] Arrival date & time: 11/27/16  4098     History   Chief Complaint Chief Complaint  Patient presents with  . Abdominal Pain    HPI Christine Callahan is a 41 y.o. female.  HPI Patient presents to the emergency room for intermittent lower abdominal pain. Patient states she's had these symptoms for a while. She states she was told in the past she has an ovarian cyst. Patient never followed up with a gynecologist because the symptoms went away. They returned again more recently.  She is having pain in the left lower part of her abdomen. She's also noted some white vaginal discharge. She denies any fevers or chills. She is having some dysuria and burning with urination. No vomiting or diarrhea. No trouble with her appetite.  Past Medical History:  Diagnosis Date  . Diabetes mellitus without complication (HCC)   . Genital herpes 2013  . GERD (gastroesophageal reflux disease)   . Gonorrhea 2015  . Hepatitis C 2016  . Hypertension   . Ovarian cyst     Patient Active Problem List   Diagnosis Date Noted  . Chest pain 06/04/2016  . Essential hypertension 06/04/2016  . Diabetes mellitus due to underlying condition (HCC) 06/04/2016  . Heart palpitations 06/04/2016  . Morbid obesity (HCC) 06/04/2016  . Tobacco abuse 06/04/2016  . Ingrown nail 09/19/2013  . Diabetic neuropathy (HCC) 09/19/2013  . Equinus deformity of foot, acquired 09/19/2013  . Plantar keratosis, acquired 09/19/2013  . Pain in lower limb 09/19/2013  . Lumbago 09/08/2011  . Pain in joint, ankle and foot 09/08/2011  . Sciatica of left side 09/08/2011    Past Surgical History:  Procedure Laterality Date  . ABDOMINAL HYSTERECTOMY    . CHOLECYSTECTOMY      OB History    No data available       Home Medications    Prior to Admission medications   Medication Sig Start Date End Date Taking? Authorizing Provider  carvedilol (COREG) 12.5 MG tablet Take 2  tablets (25 mg total) by mouth 2 (two) times daily. 06/04/16  Yes Dhungel, Nishant, MD  cloNIDine (CATAPRES) 0.2 MG tablet Take 1 tablet (0.2 mg total) by mouth 2 (two) times daily. 02/10/16  Yes Camnitz, Will Daphine Deutscher, MD  gabapentin (NEURONTIN) 400 MG capsule Take 400 mg by mouth 2 (two) times daily as needed (pain).   Yes [provider]  hydrochlorothiazide (HYDRODIURIL) 25 MG tablet Take 25 mg by mouth daily. 05/22/16  Yes [provider]  ibuprofen (ADVIL,MOTRIN) 800 MG tablet Take 1 tablet (800 mg total) by mouth 3 (three) times daily. 09/11/16  Yes Law, Alexandra M, PA-C  metFORMIN (GLUCOPHAGE) 1000 MG tablet Take 1,000 mg by mouth 2 (two) times daily with a meal.   Yes [provider]  Multiple Vitamin (MULTIVITAMIN WITH MINERALS) TABS tablet Take 1 tablet by mouth daily.   Yes [provider]  potassium chloride SA (K-DUR,KLOR-CON) 20 MEQ tablet Take 1 tablet (20 mEq total) by mouth daily. 06/04/16  Yes Dhungel, Nishant, MD  cephALEXin (KEFLEX) 500 MG capsule Take 1 capsule (500 mg total) by mouth 4 (four) times daily. 09/11/16   Law, Waylan Boga, PA-C  HYDROcodone-acetaminophen (NORCO) 5-325 MG tablet Take 1-2 tablets by mouth every 6 (six) hours as needed (for pain). Patient not taking: Reported on 06/04/2016 03/20/16   Molpus, John, MD  lisinopril (PRINIVIL,ZESTRIL) 20 MG tablet Take 1 tablet (20 mg total) by mouth  daily. 05/20/16 08/18/16  Hedges, Tinnie GensJeffrey, PA-C  metroNIDAZOLE (FLAGYL) 500 MG tablet Take 1 tablet (500 mg total) by mouth 2 (two) times daily. 11/27/16   Linwood DibblesKnapp, Akima Slaugh, MD  naproxen (NAPROSYN) 500 MG tablet Take 1 tablet (500 mg total) by mouth 2 (two) times daily. 11/27/16   Linwood DibblesKnapp, Justyna Timoney, MD  nicotine (NICODERM CQ - DOSED IN MG/24 HOURS) 21 mg/24hr patch Place 1 patch (21 mg total) onto the skin daily. 06/05/16   Dhungel, Theda BelfastNishant, MD  traMADol (ULTRAM) 50 MG tablet Take 1 tablet (50 mg total) by mouth every 6 (six) hours as needed. Patient not taking:  Reported on 06/04/2016 05/20/16   Eyvonne MechanicHedges, Jeffrey, PA-C    Family History Family History  Problem Relation Age of Onset  . Lupus Mother   . Seizures Father     Social History Social History  Substance Use Topics  . Smoking status: Current Every Day Smoker    Packs/day: 0.50    Years: 20.00    Types: Cigarettes  . Smokeless tobacco: Never Used  . Alcohol use Yes     Comment: occasional     Allergies   Patient has no known allergies.   Review of Systems Review of Systems  All other systems reviewed and are negative.    Physical Exam Updated Vital Signs BP (!) 163/108 (BP Location: Right Wrist)   Pulse (!) 56   Temp 98.7 F (37.1 C) (Oral)   Resp 18   Ht 1.778 m (5\' 10" )   Wt 119.3 kg (263 lb)   SpO2 99%   BMI 37.74 kg/m   Physical Exam  Constitutional: She appears well-developed and well-nourished. No distress.  HENT:  Head: Normocephalic and atraumatic.  Right Ear: External ear normal.  Left Ear: External ear normal.  Eyes: Conjunctivae are normal. Right eye exhibits no discharge. Left eye exhibits no discharge. No scleral icterus.  Neck: Neck supple. No tracheal deviation present.  Cardiovascular: Normal rate, regular rhythm and intact distal pulses.   Pulmonary/Chest: Effort normal and breath sounds normal. No stridor. No respiratory distress. She has no wheezes. She has no rales.  Abdominal: Soft. Bowel sounds are normal. She exhibits no distension. There is no tenderness. There is no rebound and no guarding.  Genitourinary: Pelvic exam was performed with patient supine. There is no rash, tenderness or lesion on the right labia. There is no rash, tenderness or lesion on the left labia. Cervix exhibits no motion tenderness and no discharge. Right adnexum displays no mass, no tenderness and no fullness. Left adnexum displays tenderness. Left adnexum displays no mass and no fullness. Vaginal discharge found.  Genitourinary Comments: Absent uterus, left adenxal  ttp, white discharge  Musculoskeletal: She exhibits no edema or tenderness.  Neurological: She is alert. She has normal strength. No cranial nerve deficit (no facial droop, extraocular movements intact, no slurred speech) or sensory deficit. She exhibits normal muscle tone. She displays no seizure activity. Coordination normal.  Skin: Skin is warm and dry. No rash noted.  Psychiatric: She has a normal mood and affect.  Nursing note and vitals reviewed.    ED Treatments / Results  Labs (all labs ordered are listed, but only abnormal results are displayed) Labs Reviewed  WET PREP, GENITAL - Abnormal; Notable for the following:       Result Value   Clue Cells Wet Prep HPF POC PRESENT (*)    WBC, Wet Prep HPF POC FEW (*)    All other components within normal limits  URINALYSIS, ROUTINE W REFLEX MICROSCOPIC - Abnormal; Notable for the following:    Protein, ur 100 (*)    All other components within normal limits  URINALYSIS, MICROSCOPIC (REFLEX) - Abnormal; Notable for the following:    Bacteria, UA RARE (*)    Squamous Epithelial / LPF 0-5 (*)    All other components within normal limits  PREGNANCY, URINE  RPR  HIV ANTIBODY (ROUTINE TESTING)  GC/CHLAMYDIA PROBE AMP ([place]) NOT AT [facility]      Procedures Procedures (including critical care time)  Medications Ordered in ED Medications  ketorolac (TORADOL) 30 MG/ML injection 30 mg (30 mg Intramuscular Given 11/27/16 1153)     Initial Impression / Assessment and Plan / ED Course  I have reviewed the triage vital signs and the nursing notes.  Pertinent labs & imaging results that were available during my care of the patient were reviewed by me and considered in my medical decision making (see chart for details).   patient presents to the emergency room for persistent pain in the left adnexal region. No mass palpable on exam.  She's not having any gastrointestinal symptoms to suggest diverticulitis. Urinalysis does not suggest  UTI. She does have clue cells and complaints of vaginal discharge. I'll prescribe Flagyl. Doubt PID based on her hysterectomy however she could have an ovarian cyst.  Her exam is benign. I'm not concerned about ovarian torsion.  I will give her prescription for anti-inflammatory medications and recommend follow-up with her primary doctor or gynecologist. Final Clinical Impressions(s) / ED Diagnoses   Final diagnoses:  Hypertension, unspecified type  Left lower quadrant pain  Acute vaginitis    New Prescriptions New Prescriptions   METRONIDAZOLE (FLAGYL) 500 MG TABLET    Take 1 tablet (500 mg total) by mouth 2 (two) times daily.   NAPROXEN (NAPROSYN) 500 MG TABLET    Take 1 tablet (500 mg total) by mouth 2 (two) times daily.     Linwood Dibbles, MD 11/27/16 [id number]

## 2016-11-28 LAB — GC/CHLAMYDIA PROBE AMP (~~LOC~~) NOT AT ARMC
Chlamydia: NEGATIVE
NEISSERIA GONORRHEA: NEGATIVE

## 2016-11-28 LAB — HIV ANTIBODY (ROUTINE TESTING W REFLEX): HIV SCREEN 4TH GENERATION: NONREACTIVE

## 2016-11-28 LAB — RPR: RPR: NONREACTIVE

## 2017-02-07 MED FILL — CloNIDine HCL 0.2 MG TAB: 0.2 | 30 days supply | Qty: 60 | Fill #1

## 2017-02-07 MED FILL — CARVEDILOL 12.5 MG TABLET: 12.5 | 30 days supply | Qty: 60 | Fill #2

## 2017-03-01 ENCOUNTER — Emergency Department (HOSPITAL_BASED_OUTPATIENT_CLINIC_OR_DEPARTMENT_OTHER)
Admission: EM | Admit: 2017-03-01 | Discharge: 2017-03-01 | Disposition: A | Discharge status: Home / Self Care | Payer: Medicaid Other | Attending: Emergency Medicine | Admitting: Emergency Medicine

## 2017-03-01 ENCOUNTER — Encounter (HOSPITAL_BASED_OUTPATIENT_CLINIC_OR_DEPARTMENT_OTHER): Payer: Self-pay | Admitting: *Deleted

## 2017-03-01 DIAGNOSIS — Z79899 Other long term (current) drug therapy: Secondary | ICD-10-CM | POA: Insufficient documentation

## 2017-03-01 DIAGNOSIS — E1140 Type 2 diabetes mellitus with diabetic neuropathy, unspecified: Secondary | ICD-10-CM | POA: Insufficient documentation

## 2017-03-01 DIAGNOSIS — F17210 Nicotine dependence, cigarettes, uncomplicated: Secondary | ICD-10-CM | POA: Insufficient documentation

## 2017-03-01 DIAGNOSIS — I10 Essential (primary) hypertension: Secondary | ICD-10-CM | POA: Insufficient documentation

## 2017-03-01 DIAGNOSIS — H00014 Hordeolum externum left upper eyelid: Secondary | ICD-10-CM

## 2017-03-01 DIAGNOSIS — L03213 Periorbital cellulitis: Secondary | ICD-10-CM

## 2017-03-01 DIAGNOSIS — Z7984 Long term (current) use of oral hypoglycemic drugs: Secondary | ICD-10-CM | POA: Insufficient documentation

## 2017-03-01 MED ORDER — AMOXICILLIN-POT CLAVULANATE 875-125 MG PO TABS: 1.0000 | ORAL_TABLET | Freq: Two times a day (BID) | ORAL | 0 refills | Status: DC

## 2017-03-01 MED ORDER — GENTAMICIN SULFATE 0.3 % OP SOLN: 1.0000 [drp] | OPHTHALMIC | 0 refills | Status: DC

## 2017-03-01 MED FILL — GENTAMICIN 3 MG/ML EYE DRP: 0.3 | 16 days supply | Qty: 5 | Fill #0

## 2017-03-01 MED FILL — AMOX-CLAV 875-125 MG TABLET: 875-125 | 7 days supply | Qty: 14 | Fill #0

## 2017-03-01 NOTE — ED Triage Notes (Signed)
Left eye lid swollen. Itching and pain.

## 2017-03-01 NOTE — Discharge Instructions (Signed)
After placing drops in your eye, place a warm washcloth over your closed eye for 10 minutes. Do this 3 times per day. Follow-up with your primary care if not showing marked improvement in the next 72 hours

## 2017-03-01 NOTE — ED Provider Notes (Signed)
[facility] Provider Note   CSN: [id number] Arrival date & time: 03/01/17  1552     History   Chief Complaint Chief Complaint  Patient presents with  . Eye Problem    HPI Christine Callahan is a 41 y.o. female.Chief complaint is left eye pain and swelling.  HPI:  41 year old female. Spontaneous left eyelid swelling. Upper eyelid is swollen and now painful. States it is "draining pus" when she squeezes on it. This is happened to her in the past presents tonight for about the last 7 days now. No visual loss or blurring. No right eye symptoms.  Past Medical History:  Diagnosis Date  . Diabetes mellitus without complication (HCC)   . Genital herpes 2013  . GERD (gastroesophageal reflux disease)   . Gonorrhea 2015  . Hepatitis C 2016  . Hypertension   . Ovarian cyst     Patient Active Problem List   Diagnosis Date Noted  . Chest pain 06/04/2016  . Essential hypertension 06/04/2016  . Diabetes mellitus due to underlying condition (HCC) 06/04/2016  . Heart palpitations 06/04/2016  . Morbid obesity (HCC) 06/04/2016  . Tobacco abuse 06/04/2016  . Ingrown nail 09/19/2013  . Diabetic neuropathy (HCC) 09/19/2013  . Equinus deformity of foot, acquired 09/19/2013  . Plantar keratosis, acquired 09/19/2013  . Pain in lower limb 09/19/2013  . Lumbago 09/08/2011  . Pain in joint, ankle and foot 09/08/2011  . Sciatica of left side 09/08/2011    Past Surgical History:  Procedure Laterality Date  . ABDOMINAL HYSTERECTOMY    . CHOLECYSTECTOMY      OB History    No data available       Home Medications    Prior to Admission medications   Medication Sig Start Date End Date Taking? Authorizing Provider  carvedilol (COREG) 12.5 MG tablet Take 2 tablets (25 mg total) by mouth 2 (two) times daily. 06/04/16  Yes Dhungel, Nishant, MD  cloNIDine (CATAPRES) 0.2 MG tablet Take 1 tablet (0.2 mg total) by mouth 2 (two) times daily. 02/10/16  Yes Camnitz, Will Daphine Deutscher, MD    hydrochlorothiazide (HYDRODIURIL) 25 MG tablet Take 25 mg by mouth daily. 05/22/16  Yes [provider]  Multiple Vitamin (MULTIVITAMIN WITH MINERALS) TABS tablet Take 1 tablet by mouth daily.   Yes [provider]  amoxicillin-clavulanate (AUGMENTIN) 875-125 MG tablet Take 1 tablet by mouth 2 (two) times daily. 03/01/17   Rolland Porter, MD  cephALEXin (KEFLEX) 500 MG capsule Take 1 capsule (500 mg total) by mouth 4 (four) times daily. 09/11/16   Law, Waylan Boga, PA-C  gabapentin (NEURONTIN) 400 MG capsule Take 400 mg by mouth 2 (two) times daily as needed (pain).    [provider]  gentamicin (GARAMYCIN) 0.3 % ophthalmic solution Place 1 drop into the left eye every 4 (four) hours. 03/01/17   Rolland Porter, MD  HYDROcodone-acetaminophen (NORCO) 5-325 MG tablet Take 1-2 tablets by mouth every 6 (six) hours as needed (for pain). Patient not taking: Reported on 06/04/2016 03/20/16   Molpus, Jonny Ruiz, MD  ibuprofen (ADVIL,MOTRIN) 800 MG tablet Take 1 tablet (800 mg total) by mouth 3 (three) times daily. 09/11/16   Law, Waylan Boga, PA-C  lisinopril (PRINIVIL,ZESTRIL) 20 MG tablet Take 1 tablet (20 mg total) by mouth daily. 05/20/16 08/18/16  Hedges, Tinnie Gens, PA-C  metFORMIN (GLUCOPHAGE) 1000 MG tablet Take 1,000 mg by mouth 2 (two) times daily with a meal.    [provider]  metroNIDAZOLE (FLAGYL) 500 MG tablet Take  1 tablet (500 mg total) by mouth 2 (two) times daily. 11/27/16   Linwood Dibbles, MD  naproxen (NAPROSYN) 500 MG tablet Take 1 tablet (500 mg total) by mouth 2 (two) times daily. 11/27/16   Linwood Dibbles, MD  nicotine (NICODERM CQ - DOSED IN MG/24 HOURS) 21 mg/24hr patch Place 1 patch (21 mg total) onto the skin daily. 06/05/16   Dhungel, Nishant, MD  potassium chloride SA (K-DUR,KLOR-CON) 20 MEQ tablet Take 1 tablet (20 mEq total) by mouth daily. 06/04/16   Dhungel, Theda Belfast, MD  traMADol (ULTRAM) 50 MG tablet Take 1 tablet (50 mg total) by mouth every 6 (six) hours as  needed. Patient not taking: Reported on 06/04/2016 05/20/16   Eyvonne Mechanic, PA-C    Family History Family History  Problem Relation Age of Onset  . Lupus Mother   . Seizures Father     Social History Social History  Substance Use Topics  . Smoking status: Current Every Day Smoker    Packs/day: 0.50    Years: 20.00    Types: Cigarettes  . Smokeless tobacco: Never Used  . Alcohol use Yes     Comment: occasional     Allergies   Patient has no known allergies.   Review of Systems Review of Systems  Constitutional: Negative for appetite change, chills, diaphoresis, fatigue and fever.  HENT: Negative for mouth sores, sore throat and trouble swallowing.   Eyes: Positive for pain and discharge. Negative for visual disturbance.  Respiratory: Negative for cough, chest tightness, shortness of breath and wheezing.   Cardiovascular: Negative for chest pain.  Gastrointestinal: Negative for abdominal distention, abdominal pain, diarrhea, nausea and vomiting.  Endocrine: Negative for polydipsia, polyphagia and polyuria.  Genitourinary: Negative for dysuria, frequency and hematuria.  Musculoskeletal: Negative for gait problem.  Skin: Negative for color change, pallor and rash.  Neurological: Negative for dizziness, syncope, light-headedness and headaches.  Hematological: Does not bruise/bleed easily.  Psychiatric/Behavioral: Negative for behavioral problems and confusion.     Physical Exam Updated Vital Signs BP (!) 175/115 (BP Location: Left Arm)   Pulse 71   Temp 98 F (36.7 C) (Oral)   Resp 18   Ht 5\' 10"  (1.778 m)   Wt 119.3 kg (263 lb)   SpO2 100%   BMI 37.74 kg/m   Physical Exam  Constitutional: She is oriented to person, place, and time. She appears well-developed and well-nourished. No distress.  HENT:  Head: Normocephalic.  Eyes: Pupils are equal, round, and reactive to light. Conjunctivae are normal. No scleral icterus.  Left upper lid swelling. Topically  swollen laterally over the lacrimal gland. No lower lid swelling. Has apparent hordeolum on the right upper medial lid with some surrounding lid edema and erythema. Globe appears normal. No conjunctival injection. Normal cornea. No extra ocular movement without pain. No proptosis. Normal reactive pupil correctly, and consensually.  Neck: Normal range of motion. Neck supple. No thyromegaly present.  Cardiovascular: Normal rate and regular rhythm.  Exam reveals no gallop and no friction rub.   No murmur heard. Pulmonary/Chest: Effort normal and breath sounds normal. No respiratory distress. She has no wheezes. She has no rales.  Abdominal: Soft. Bowel sounds are normal. She exhibits no distension. There is no tenderness. There is no rebound.  Musculoskeletal: Normal range of motion.  Neurological: She is alert and oriented to person, place, and time.  Skin: Skin is warm and dry. No rash noted.  Psychiatric: She has a normal mood and affect. Her behavior is normal.  ED Treatments / Results  Labs (all labs ordered are listed, but only abnormal results are displayed) Labs Reviewed - No data to display  EKG  EKG Interpretation None       Radiology No results found.  Procedures Procedures (including critical care time)  Medications Ordered in ED Medications - No data to display   Initial Impression / Assessment and Plan / ED Course  I have reviewed the triage vital signs and the nursing notes.  Pertinent labs & imaging results that were available during my care of the patient were reviewed by me and considered in my medical decision making (see chart for details).     Mild, early periorbital saline as. No symptoms or findings to suggest orbital infection. Plan warm compresses, Garamycin drops, Augmentin. Primary care not improving in 48-72 hours.  Final Clinical Impressions(s) / ED Diagnoses   Final diagnoses:  Hordeolum externum of left upper eyelid  Periorbital  cellulitis of left eye    New Prescriptions New Prescriptions   AMOXICILLIN-CLAVULANATE (AUGMENTIN) 875-125 MG TABLET    Take 1 tablet by mouth 2 (two) times daily.   GENTAMICIN (GARAMYCIN) 0.3 % OPHTHALMIC SOLUTION    Place 1 drop into the left eye every 4 (four) hours.     Rolland Porter, MD 03/01/17 [phone]

## 2017-04-23 MED FILL — CYCLOBENZAPRINE 10 MG TAB: 10 | 10 days supply | Qty: 20 | Fill #0

## 2017-04-23 MED FILL — CARVEDILOL 25 MG TABLET: 25 | 30 days supply | Qty: 60 | Fill #0

## 2017-04-23 MED FILL — CLINDAMYCIN HCL 150 MG CAPS: 150 | 5 days supply | Qty: 20 | Fill #0

## 2017-04-23 MED FILL — traMADol HCL 50 MG TABS: 50 | 5 days supply | Qty: 20 | Fill #0

## 2017-06-14 MED FILL — HYDROCHLOROTHIAZIDE 25 MG T: 25 | 30 days supply | Qty: 30 | Fill #0

## 2017-06-14 MED FILL — cloNIDine HCL 0.2 MG TABS: 0.2 | 30 days supply | Qty: 60 | Fill #0

## 2017-08-07 ENCOUNTER — Emergency Department (HOSPITAL_BASED_OUTPATIENT_CLINIC_OR_DEPARTMENT_OTHER)
Admission: EM | Admit: 2017-08-07 | Discharge: 2017-08-07 | Disposition: A | Discharge status: Home / Self Care | Payer: Self-pay | Attending: Emergency Medicine | Admitting: Emergency Medicine

## 2017-08-07 ENCOUNTER — Other Ambulatory Visit: Payer: Self-pay

## 2017-08-07 ENCOUNTER — Encounter (HOSPITAL_BASED_OUTPATIENT_CLINIC_OR_DEPARTMENT_OTHER): Payer: Self-pay | Admitting: Emergency Medicine

## 2017-08-07 DIAGNOSIS — F17210 Nicotine dependence, cigarettes, uncomplicated: Secondary | ICD-10-CM | POA: Insufficient documentation

## 2017-08-07 DIAGNOSIS — Z7984 Long term (current) use of oral hypoglycemic drugs: Secondary | ICD-10-CM | POA: Insufficient documentation

## 2017-08-07 DIAGNOSIS — I10 Essential (primary) hypertension: Secondary | ICD-10-CM | POA: Insufficient documentation

## 2017-08-07 DIAGNOSIS — E119 Type 2 diabetes mellitus without complications: Secondary | ICD-10-CM | POA: Insufficient documentation

## 2017-08-07 DIAGNOSIS — J069 Acute upper respiratory infection, unspecified: Secondary | ICD-10-CM | POA: Insufficient documentation

## 2017-08-07 DIAGNOSIS — Z79899 Other long term (current) drug therapy: Secondary | ICD-10-CM | POA: Insufficient documentation

## 2017-08-07 MED ORDER — HYDROCHLOROTHIAZIDE 25 MG PO TABS: 25.0000 mg | ORAL_TABLET | Freq: Every day | ORAL | 0 refills | Status: DC

## 2017-08-07 MED ORDER — CARVEDILOL 12.5 MG PO TABS: 25.0000 mg | ORAL_TABLET | Freq: Two times a day (BID) | ORAL | 0 refills | Status: AC

## 2017-08-07 MED ORDER — IBUPROFEN 400 MG PO TABS
400.0000 mg | ORAL_TABLET | Freq: Once | ORAL | Status: AC
  Administered 2017-08-07: 400 mg via ORAL
  Filled 2017-08-07: qty 1

## 2017-08-07 MED ORDER — CLONIDINE HCL 0.2 MG PO TABS: 0.2000 mg | ORAL_TABLET | Freq: Two times a day (BID) | ORAL | 0 refills | Status: AC

## 2017-08-07 NOTE — ED Provider Notes (Signed)
[facility] EMERGENCY DEPARTMENT Provider Note   CSN: [id number] Arrival date & time: 08/07/17  1191     History   Chief Complaint Chief Complaint  Patient presents with  . Letter for School/Work    HPI Christine Callahan is a 42 y.o. female.  Patient is a 42 year old female with a history of diabetes, hepatitis C and hypertension who presents requesting a work note.  She states that she has had cold-like symptoms for about a week.  She had previously had some chills but has not had any chills or fever in the last 48 hours.  Is a little bit of runny nose but otherwise feels much better.  No productive cough.  No shortness of breath.  She is requesting a note to go back to work.  She also states that she is out of her blood pressure medicine and requests a refill on this.  She is otherwise asymptomatic from this.      Past Medical History:  Diagnosis Date  . Diabetes mellitus without complication (HCC)   . Genital herpes 2013  . GERD (gastroesophageal reflux disease)   . Gonorrhea 2015  . Hepatitis C 2016  . Hypertension   . Ovarian cyst     Patient Active Problem List   Diagnosis Date Noted  . Chest pain 06/04/2016  . Essential hypertension 06/04/2016  . Diabetes mellitus due to underlying condition (HCC) 06/04/2016  . Heart palpitations 06/04/2016  . Morbid obesity (HCC) 06/04/2016  . Tobacco abuse 06/04/2016  . Ingrown nail 09/19/2013  . Diabetic neuropathy (HCC) 09/19/2013  . Equinus deformity of foot, acquired 09/19/2013  . Plantar keratosis, acquired 09/19/2013  . Pain in lower limb 09/19/2013  . Lumbago 09/08/2011  . Pain in joint, ankle and foot 09/08/2011  . Sciatica of left side 09/08/2011    Past Surgical History:  Procedure Laterality Date  . ABDOMINAL HYSTERECTOMY    . CHOLECYSTECTOMY      OB History    No data available       Home Medications    Prior to Admission medications   Medication Sig Start Date End Date Taking?  Authorizing Provider  amoxicillin-clavulanate (AUGMENTIN) 875-125 MG tablet Take 1 tablet by mouth 2 (two) times daily. 03/01/17   Rolland Porter, MD  carvedilol (COREG) 12.5 MG tablet Take 2 tablets (25 mg total) by mouth 2 (two) times daily. 08/07/17   Rolan Bucco, MD  cephALEXin (KEFLEX) 500 MG capsule Take 1 capsule (500 mg total) by mouth 4 (four) times daily. 09/11/16   Emi Holes, PA-C  cloNIDine (CATAPRES) 0.2 MG tablet Take 1 tablet (0.2 mg total) by mouth 2 (two) times daily. 08/07/17   Rolan Bucco, MD  gabapentin (NEURONTIN) 400 MG capsule Take 400 mg by mouth 2 (two) times daily as needed (pain).    [provider]  gentamicin (GARAMYCIN) 0.3 % ophthalmic solution Place 1 drop into the left eye every 4 (four) hours. 03/01/17   Rolland Porter, MD  hydrochlorothiazide (HYDRODIURIL) 25 MG tablet Take 1 tablet (25 mg total) by mouth daily. 08/07/17   Rolan Bucco, MD  HYDROcodone-acetaminophen (NORCO) 5-325 MG tablet Take 1-2 tablets by mouth every 6 (six) hours as needed (for pain). Patient not taking: Reported on 06/04/2016 03/20/16   Molpus, Jonny Ruiz, MD  ibuprofen (ADVIL,MOTRIN) 800 MG tablet Take 1 tablet (800 mg total) by mouth 3 (three) times daily. 09/11/16   Law, Waylan Boga, PA-C  lisinopril (PRINIVIL,ZESTRIL) 20 MG tablet Take 1 tablet (20  mg total) by mouth daily. 05/20/16 08/18/16  Hedges, Tinnie Gens, PA-C  metFORMIN (GLUCOPHAGE) 1000 MG tablet Take 1,000 mg by mouth 2 (two) times daily with a meal.    [provider]  metroNIDAZOLE (FLAGYL) 500 MG tablet Take 1 tablet (500 mg total) by mouth 2 (two) times daily. 11/27/16   Linwood Dibbles, MD  Multiple Vitamin (MULTIVITAMIN WITH MINERALS) TABS tablet Take 1 tablet by mouth daily.    [provider]  naproxen (NAPROSYN) 500 MG tablet Take 1 tablet (500 mg total) by mouth 2 (two) times daily. 11/27/16   Linwood Dibbles, MD  nicotine (NICODERM CQ - DOSED IN MG/24 HOURS) 21 mg/24hr patch Place 1 patch (21 mg total) onto the skin  daily. 06/05/16   Dhungel, Nishant, MD  potassium chloride SA (K-DUR,KLOR-CON) 20 MEQ tablet Take 1 tablet (20 mEq total) by mouth daily. 06/04/16   Dhungel, Theda Belfast, MD  traMADol (ULTRAM) 50 MG tablet Take 1 tablet (50 mg total) by mouth every 6 (six) hours as needed. Patient not taking: Reported on 06/04/2016 05/20/16   Eyvonne Mechanic, PA-C  traMADol-acetaminophen (ULTRACET) 37.5-325 MG per tablet Take 1 tablet by mouth every 4 (four) hours as needed for pain. 10/16/11 11/16/11  Kirsteins, Victorino Sparrow, MD    Family History Family History  Problem Relation Age of Onset  . Lupus Mother   . Seizures Father     Social History Social History   Tobacco Use  . Smoking status: Current Every Day Smoker    Packs/day: 0.50    Years: 20.00    Pack years: 10.00    Types: Cigarettes  . Smokeless tobacco: Never Used  Substance Use Topics  . Alcohol use: Yes    Comment: occasional  . Drug use: No     Allergies   Patient has no known allergies.   Review of Systems Review of Systems  Constitutional: Positive for chills and fatigue. Negative for diaphoresis and fever.  HENT: Positive for congestion, postnasal drip, rhinorrhea and sneezing.   Eyes: Negative.   Respiratory: Positive for cough. Negative for chest tightness and shortness of breath.   Cardiovascular: Negative for chest pain and leg swelling.  Gastrointestinal: Negative for abdominal pain, blood in stool, diarrhea, nausea and vomiting.  Genitourinary: Negative for difficulty urinating, flank pain, frequency and hematuria.  Musculoskeletal: Positive for back pain and myalgias. Negative for arthralgias.  Skin: Negative for rash.  Neurological: Negative for dizziness, speech difficulty, weakness, numbness and headaches.     Physical Exam Updated Vital Signs BP (!) 177/117 (BP Location: Right Arm)   Pulse 86   Temp 98.4 F (36.9 C) (Oral)   Resp 18   Ht 5\' 10"  (1.778 m)   Wt 117.9 kg (260 lb)   SpO2 97%   BMI 37.31 kg/m     Physical Exam  Constitutional: She is oriented to person, place, and time. She appears well-developed and well-nourished.  HENT:  Head: Normocephalic and atraumatic.  Mouth/Throat: Oropharynx is clear and moist.  Eyes: Pupils are equal, round, and reactive to light.  Neck: Normal range of motion. Neck supple.  Cardiovascular: Normal rate, regular rhythm and normal heart sounds.  Pulmonary/Chest: Effort normal and breath sounds normal. No respiratory distress. She has no wheezes. She has no rales. She exhibits no tenderness.  Abdominal: Soft. Bowel sounds are normal. There is no tenderness. There is no rebound and no guarding.  Musculoskeletal: Normal range of motion. She exhibits no edema.  Lymphadenopathy:    She has no  cervical adenopathy.  Neurological: She is alert and oriented to person, place, and time.  Skin: Skin is warm and dry. No rash noted.  Psychiatric: She has a normal mood and affect.     ED Treatments / Results  Labs (all labs ordered are listed, but only abnormal results are displayed) Labs Reviewed - No data to display  EKG  EKG Interpretation None       Radiology No results found.  Procedures Procedures (including critical care time)  Medications Ordered in ED Medications - No data to display   Initial Impression / Assessment and Plan / ED Course  I have reviewed the triage vital signs and the nursing notes.  Pertinent labs & imaging results that were available during my care of the patient were reviewed by me and considered in my medical decision making (see chart for details).     Patient is a well-appearing female who presents with URI symptoms.  Her symptoms appear to be improving.  She has had no fever for 48 hours.  She was given a note to go back to work tomorrow.  She also was given a refill on her blood pressure medicines.  A recheck on her blood pressure was 158/96 which was done by me prior to discharge.  She was given a list of  financial resources for possible outpatient follow-up.  Return precautions were given.  Final Clinical Impressions(s) / ED Diagnoses   Final diagnoses:  Viral upper respiratory tract infection  Hypertension, unspecified type    ED Discharge Orders        Ordered    cloNIDine (CATAPRES) 0.2 MG tablet  2 times daily     08/07/17 0957    hydrochlorothiazide (HYDRODIURIL) 25 MG tablet  Daily     08/07/17 0957    carvedilol (COREG) 12.5 MG tablet  2 times daily     08/07/17 0957       Rolan BuccoBelfi, Standley Bargo, MD 08/07/17 1000

## 2017-08-07 NOTE — ED Triage Notes (Signed)
Patient states that she has had a cold with generalized aches x 1 week  - she went back to work today and they need a note for her to return

## 2017-11-30 MED FILL — CARVEDILOL 25 MG TABLET: 25 | 30 days supply | Qty: 60 | Fill #0

## 2017-11-30 MED FILL — cloNIDine HCL 0.2 MG TABS: 0.2 | 30 days supply | Qty: 60 | Fill #0

## 2017-11-30 MED FILL — metroNIDAZOLE 500 MG TABS: 500 | 7 days supply | Qty: 14 | Fill #0

## 2017-12-13 MED FILL — LOSARTAN POTASSIUM-HCTZ 50-: 50-12.5 | 30 days supply | Qty: 30 | Fill #0

## 2017-12-13 MED FILL — AMLODIPINE BESYLATE 10 MG T: 10 | 30 days supply | Qty: 30 | Fill #0

## 2017-12-25 MED FILL — BENZONATATE 200 MG CAPS: 200 | 7 days supply | Qty: 21 | Fill #0

## 2017-12-25 MED FILL — AZITHROMYCIN 250 MG TABLET: 250 | 5 days supply | Qty: 6 | Fill #0

## 2018-01-11 MED FILL — cloNIDine HCL 0.1 MG TABS: 0.1 | 30 days supply | Qty: 60 | Fill #0

## 2018-01-11 MED FILL — predniSONE 20 MG TABS: 20 | 5 days supply | Qty: 15 | Fill #0

## 2018-01-11 MED FILL — AMLODIPINE BESYLATE 10 MG T: 10 | 30 days supply | Qty: 30 | Fill #0

## 2018-01-11 MED FILL — LOSARTAN POTASSIUM-HCTZ 50-: 50-12.5 | 30 days supply | Qty: 30 | Fill #0

## 2018-01-11 MED FILL — CARVEDILOL 25 MG TABLET: 25 | 30 days supply | Qty: 60 | Fill #0

## 2018-10-03 IMAGING — CT CT ABD-PELV W/ CM
2 of 5 series · 16 of 46 positions shown, 18 images · IV contrast (APPLIED)
Comparison: Pelvic ultrasound 02/07/2016. CT abdomen/pelvis
11/02/2014, CT angiography 12/09/2014

CLINICAL DATA: Left lower quadrant abdominal pain for 3 days.
Urinary frequency.

EXAM:
CT ABDOMEN AND PELVIS WITH CONTRAST
TECHNIQUE: Multidetector CT imaging of the abdomen and pelvis was performed
using the standard protocol following bolus administration of
intravenous contrast.
CONTRAST:  100mL [id number] IOPAMIDOL ([id number]) INJECTION 61%

[Series 2: axial st · axial · 0.89mm/px · z∈[+523,+953]mm · 13 of 98 slices shown, 15 images]
[im 6/98  soft-tissue]
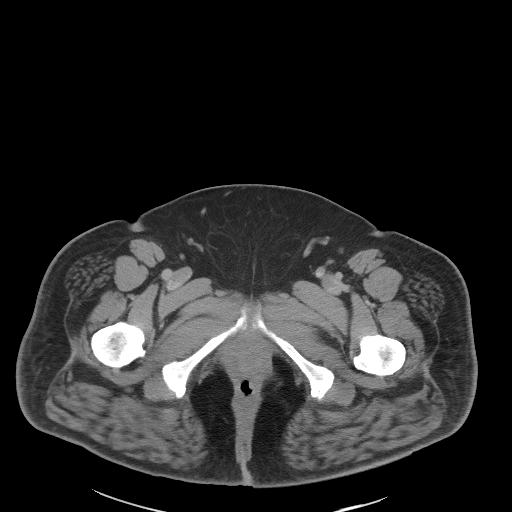
[im 6/98  bone]
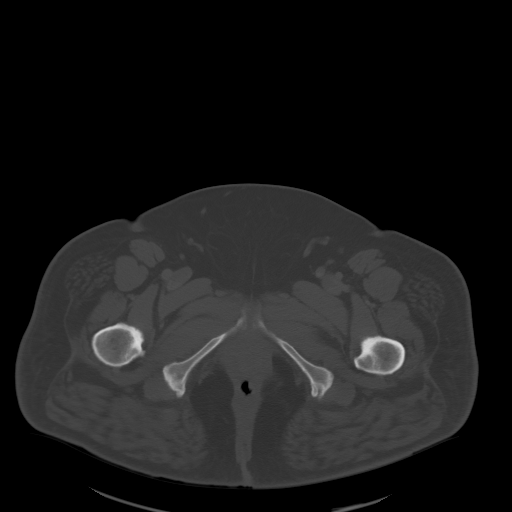
[im 16/98  soft-tissue]
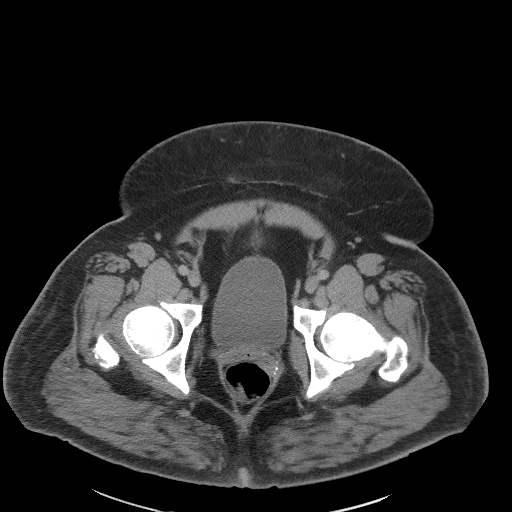
[im 21/98  soft-tissue]
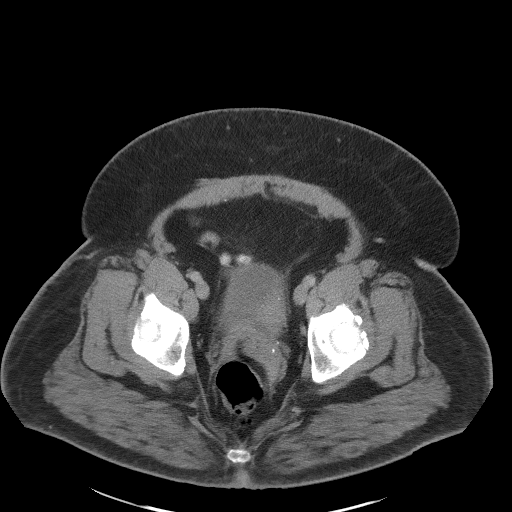
[im 26/98  soft-tissue]
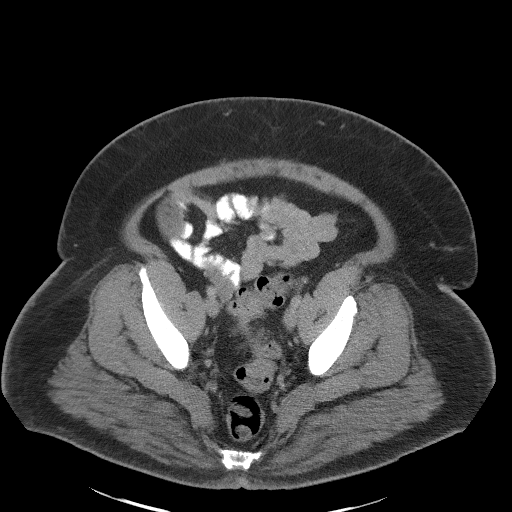
[im 36/98  soft-tissue]
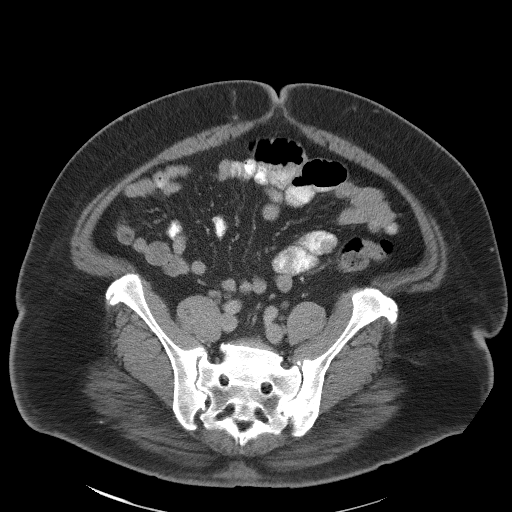
[im 41/98  soft-tissue]
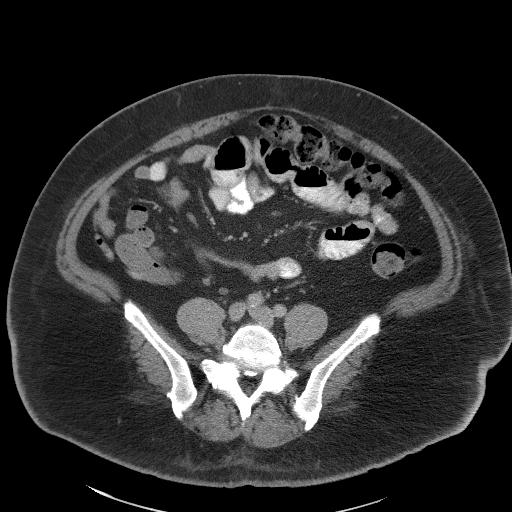
[im 52/98  soft-tissue]
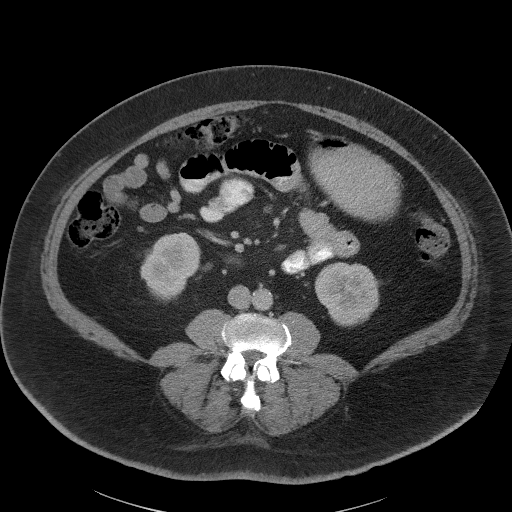
[im 57/98  soft-tissue]
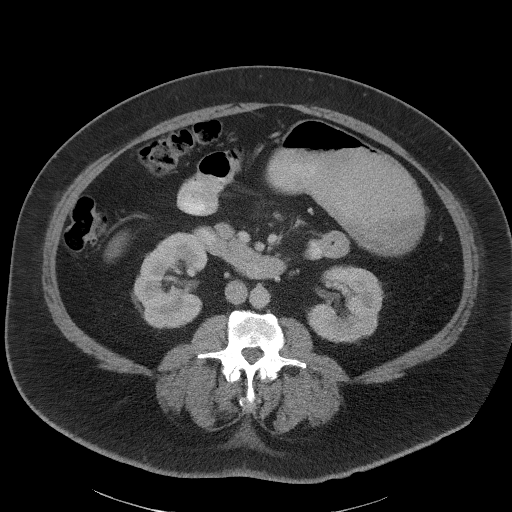
[im 62/98  soft-tissue]
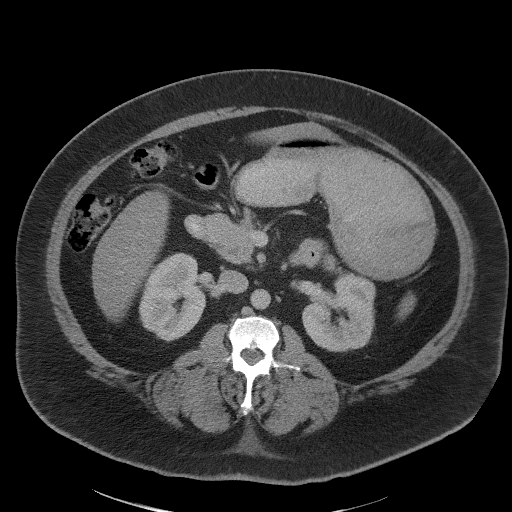
[im 62/98  bone]
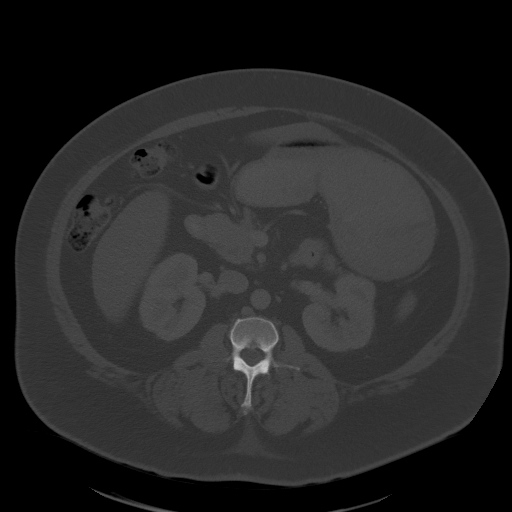
[im 72/98  soft-tissue]
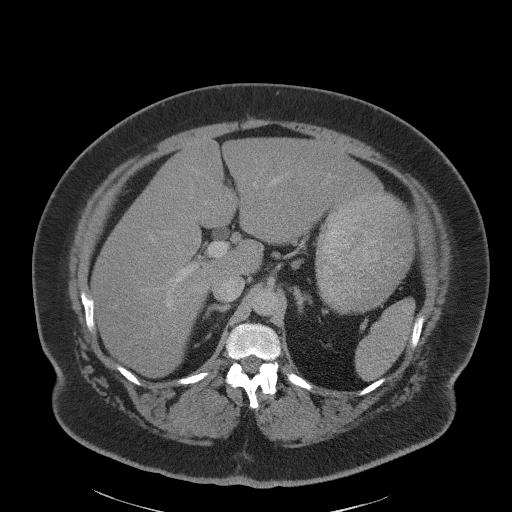
[im 77/98  soft-tissue]
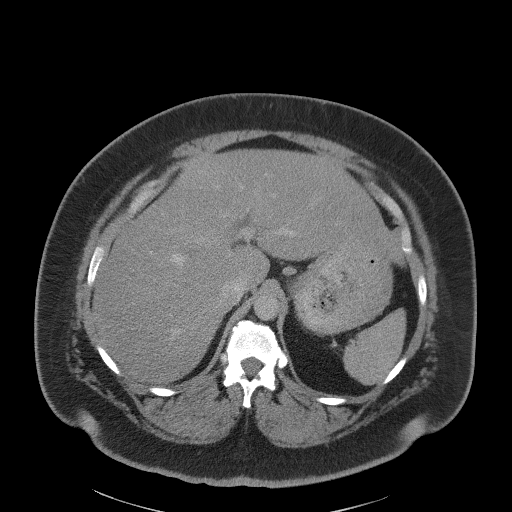
[im 82/98  soft-tissue]
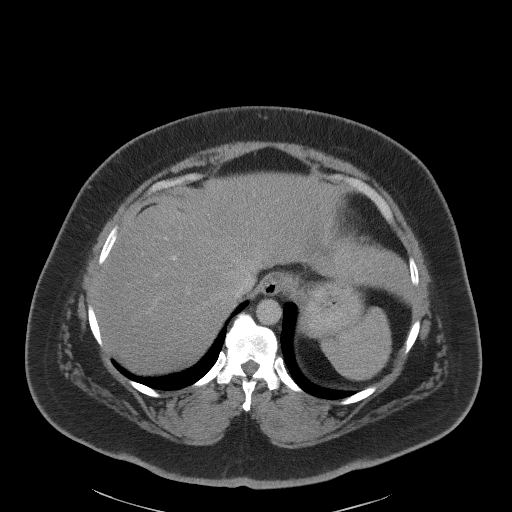
[im 92/98  soft-tissue]
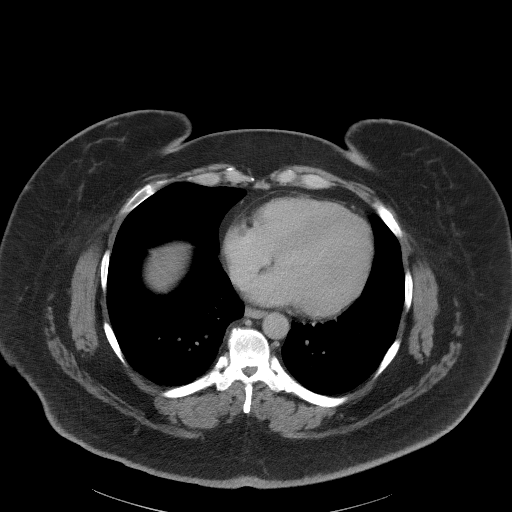

[Series 5: coronal st · coronal · 0.94mm/px · 3 of 126 slices shown]
[im 42/126  soft-tissue]
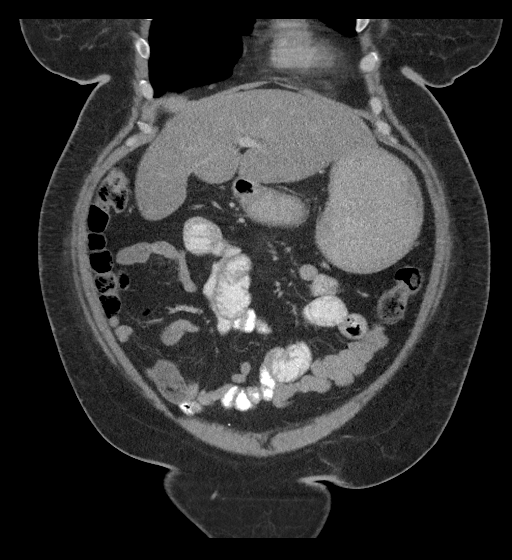
[im 56/126  soft-tissue]
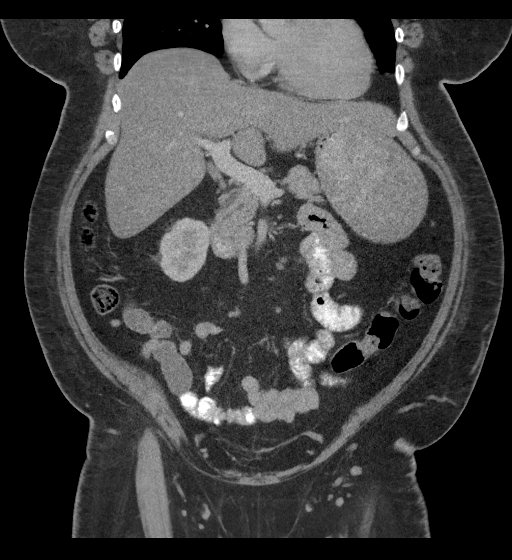
[im 70/126  soft-tissue]
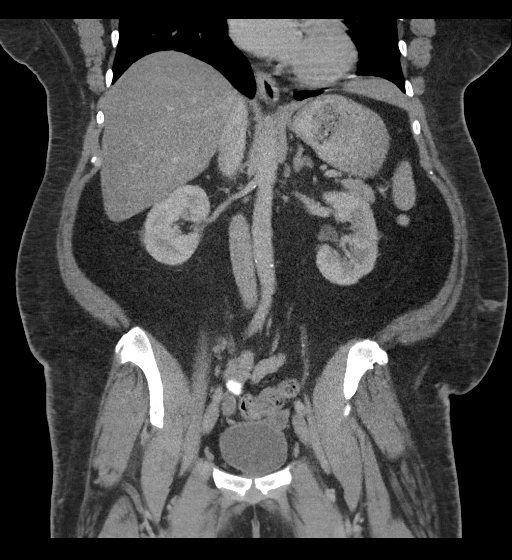

[16 of 46 positions shown; findings below may reference images not displayed]

FINDINGS: Lower chest: No acute abnormality.

Hepatobiliary: Hepatic steatosis. Left lobe of the liver is
prominent. No focal lesion. Postcholecystectomy. Biliary prominence
is unchanged from prior exam and likely reservoir effect secondary
to prior cholecystectomy.

Pancreas: No ductal dilatation or inflammation.

Spleen: Normal in size without focal abnormality. Splenule
inferiorly.

Adrenals/Urinary Tract: Adrenal glands are unremarkable. Kidneys are
normal, without renal calculi, focal lesion, or hydronephrosis.
Bladder is unremarkable.

Stomach/Bowel: Stomach is within normal limits. Appendix appears
normal. No evidence of bowel wall thickening, distention, or
inflammatory changes. No significant diverticular changes.

Vascular/Lymphatic: No significant vascular findings are present. No
enlarged abdominal or pelvic lymph nodes. Small central mesenteric
and gastrohepatic nodes are not enlarged by size criteria and
unchanged from prior.

Reproductive: Post hysterectomy. Complex left ovarian cyst on prior
ultrasound is not confidently identified. No adnexal mass seen by
CT.

Other: No free air, free fluid, or intra-abdominal fluid collection.
Tiny fat containing umbilical hernia.

Musculoskeletal: There are no acute or suspicious osseous
abnormalities. Degenerative change in the spine and both hips.
IMPRESSION: 1. No acute abnormality in the abdomen/pelvis.
2. Hepatic steatosis.

## 2019-01-08 ENCOUNTER — Encounter: Payer: Self-pay | Admitting: Cardiology

## 2019-02-07 ENCOUNTER — Emergency Department (HOSPITAL_BASED_OUTPATIENT_CLINIC_OR_DEPARTMENT_OTHER)
Admission: EM | Admit: 2019-02-07 | Discharge: 2019-02-07 | Disposition: A | Discharge status: Home / Self Care | Payer: Self-pay | Attending: Emergency Medicine | Admitting: Emergency Medicine

## 2019-02-07 ENCOUNTER — Encounter (HOSPITAL_BASED_OUTPATIENT_CLINIC_OR_DEPARTMENT_OTHER): Payer: Self-pay | Admitting: *Deleted

## 2019-02-07 ENCOUNTER — Other Ambulatory Visit: Payer: Self-pay

## 2019-02-07 DIAGNOSIS — E1140 Type 2 diabetes mellitus with diabetic neuropathy, unspecified: Secondary | ICD-10-CM | POA: Insufficient documentation

## 2019-02-07 DIAGNOSIS — F17210 Nicotine dependence, cigarettes, uncomplicated: Secondary | ICD-10-CM | POA: Insufficient documentation

## 2019-02-07 DIAGNOSIS — Z79899 Other long term (current) drug therapy: Secondary | ICD-10-CM | POA: Insufficient documentation

## 2019-02-07 DIAGNOSIS — Z202 Contact with and (suspected) exposure to infections with a predominantly sexual mode of transmission: Secondary | ICD-10-CM | POA: Insufficient documentation

## 2019-02-07 DIAGNOSIS — I10 Essential (primary) hypertension: Secondary | ICD-10-CM | POA: Insufficient documentation

## 2019-02-07 LAB — URINALYSIS, ROUTINE W REFLEX MICROSCOPIC
Bilirubin Urine: NEGATIVE
Glucose, UA: NEGATIVE mg/dL
Hgb urine dipstick: NEGATIVE
Ketones, ur: NEGATIVE mg/dL
Leukocytes,Ua: NEGATIVE
Nitrite: NEGATIVE
Protein, ur: NEGATIVE mg/dL
Specific Gravity, Urine: 1.02 (ref 1.005–1.030)
pH: 6 (ref 5.0–8.0)

## 2019-02-07 LAB — WET PREP, GENITAL
Clue Cells Wet Prep HPF POC: NONE SEEN
Sperm: NONE SEEN
Trich, Wet Prep: NONE SEEN
Yeast Wet Prep HPF POC: NONE SEEN

## 2019-02-07 MED ORDER — CEPHALEXIN 500 MG PO CAPS: 500.0000 mg | ORAL_CAPSULE | Freq: Two times a day (BID) | ORAL | 0 refills | Status: DC

## 2019-02-07 MED ORDER — CEFTRIAXONE SODIUM 250 MG IJ SOLR
250.0000 mg | Freq: Once | INTRAMUSCULAR | Status: AC
  Administered 2019-02-07: 250 mg via INTRAMUSCULAR
  Filled 2019-02-07: qty 250

## 2019-02-07 MED ORDER — LIDOCAINE HCL (PF) 1 % IJ SOLN
INTRAMUSCULAR | Status: AC
  Administered 2019-02-07: 0.9 mL
  Filled 2019-02-07: qty 5

## 2019-02-07 MED ORDER — METRONIDAZOLE 500 MG PO TABS: 500.0000 mg | ORAL_TABLET | Freq: Two times a day (BID) | ORAL | 0 refills | Status: DC

## 2019-02-07 MED ORDER — AZITHROMYCIN 250 MG PO TABS
1000.0000 mg | ORAL_TABLET | Freq: Once | ORAL | Status: AC
  Administered 2019-02-07: 1000 mg via ORAL
  Filled 2019-02-07: qty 4

## 2019-02-07 NOTE — ED Notes (Signed)
Unable to obtain discharge vital signs.  Patient is in a hurry to go home.

## 2019-02-07 NOTE — ED Provider Notes (Signed)
[facility] EMERGENCY DEPARTMENT Provider Note   CSN: [id number] Arrival date & time: 02/07/19  1625     History   Chief Complaint Chief Complaint  Patient presents with  . Exposure to STD    HPI Christine Callahan is a 43 y.o. female past medical history of diabetes, genital herpes, GERD, hypertension, hepatitis C presents emergency department today with chief complaint of requesting STD check.  Patient states her husband recently tested positive for syphilis.  He has been treated and now she is requesting testing.  She reports they have not had intercourse in "quite some time." She was seen [facility] the [facility] approximately 1 month ago and had STD testing.  She was never notified of positive results, she assumes they were negative. She is also reporting urinary frequency over the last 1 week.  She denies fever, chills, abdominal pain, nausea, vomiting, flank pain, hematuria, vaginal discharge, pelvic pain, vaginal bleeding, genital lesions, rash.    Past Medical History:  Diagnosis Date  . Diabetes mellitus without complication (HCC)   . Genital herpes 2013  . GERD (gastroesophageal reflux disease)   . Gonorrhea 2015  . Hepatitis C 2016  . Hypertension   . Ovarian cyst     Patient Active Problem List   Diagnosis Date Noted  . Chest pain 06/04/2016  . Essential hypertension 06/04/2016  . Diabetes mellitus due to underlying condition (HCC) 06/04/2016  . Heart palpitations 06/04/2016  . Morbid obesity (HCC) 06/04/2016  . Tobacco abuse 06/04/2016  . Ingrown nail 09/19/2013  . Diabetic neuropathy (HCC) 09/19/2013  . Equinus deformity of foot, acquired 09/19/2013  . Plantar keratosis, acquired 09/19/2013  . Pain in lower limb 09/19/2013  . Lumbago 09/08/2011  . Pain in joint, ankle and foot 09/08/2011  . Sciatica of left side 09/08/2011    Past Surgical History:  Procedure Laterality Date  . ABDOMINAL HYSTERECTOMY    . CHOLECYSTECTOMY       OB History    No obstetric history on file.      Home Medications    Prior to Admission medications   Medication Sig Start Date End Date Taking? Authorizing Provider  carvedilol (COREG) 12.5 MG tablet Take 2 tablets (25 mg total) by mouth 2 (two) times daily. 08/07/17   Rolan BuccoBelfi, Melanie, MD  cephALEXin (KEFLEX) 500 MG capsule Take 1 capsule (500 mg total) by mouth 2 (two) times daily for 7 days. 02/07/19 02/14/19  Albrizze, Kaitlyn E, PA-C  cloNIDine (CATAPRES) 0.2 MG tablet Take 1 tablet (0.2 mg total) by mouth 2 (two) times daily. 08/07/17   Rolan BuccoBelfi, Melanie, MD  gabapentin (NEURONTIN) 400 MG capsule Take 400 mg by mouth 2 (two) times daily as needed (pain).    [provider]  ibuprofen (ADVIL,MOTRIN) 800 MG tablet Take 1 tablet (800 mg total) by mouth 3 (three) times daily. 09/11/16   Law, Waylan BogaAlexandra M, PA-C  metFORMIN (GLUCOPHAGE) 1000 MG tablet Take 1,000 mg by mouth 2 (two) times daily with a meal.    [provider]  metroNIDAZOLE (FLAGYL) 500 MG tablet Take 1 tablet (500 mg total) by mouth 2 (two) times daily. 02/07/19   Albrizze, Kaitlyn E, PA-C  Multiple Vitamin (MULTIVITAMIN WITH MINERALS) TABS tablet Take 1 tablet by mouth daily.    [provider]  naproxen (NAPROSYN) 500 MG tablet Take 1 tablet (500 mg total) by mouth 2 (two) times daily. 11/27/16   Linwood DibblesKnapp, Jon, MD  traMADol (ULTRAM) 50 MG tablet Take 1 tablet (50  mg total) by mouth every 6 (six) hours as needed. Patient not taking: Reported on 06/04/2016 05/20/16   Eyvonne MechanicHedges, Jeffrey, PA-C  traMADol-acetaminophen (ULTRACET) 37.5-325 MG per tablet Take 1 tablet by mouth every 4 (four) hours as needed for pain. 10/16/11 11/16/11  Kirsteins, Victorino SparrowAndrew E, MD    Family History Family History  Problem Relation Age of Onset  . Lupus Mother   . Seizures Father     Social History Social History   Tobacco Use  . Smoking status: Current Every Day Smoker    Packs/day: 0.50    Years: 20.00    Pack years: 10.00    Types: Cigarettes  .  Smokeless tobacco: Never Used  Substance Use Topics  . Alcohol use: Yes    Comment: occasional  . Drug use: No     Allergies   Patient has no known allergies.   Review of Systems Review of Systems  Constitutional: Negative for chills and fever.  HENT: Negative for congestion, ear discharge, ear pain, sinus pressure, sinus pain and sore throat.   Eyes: Negative for pain and redness.  Respiratory: Negative for cough and shortness of breath.   Cardiovascular: Negative for chest pain.  Gastrointestinal: Negative for abdominal pain, constipation, diarrhea, nausea and vomiting.  Genitourinary: Positive for frequency. Negative for dysuria, flank pain, hematuria, pelvic pain, vaginal bleeding, vaginal discharge and vaginal pain.  Musculoskeletal: Negative for back pain and neck pain.  Skin: Negative for rash and wound.  Neurological: Negative for weakness, numbness and headaches.     Physical Exam Updated Vital Signs BP (!) 166/103   Pulse 78   Temp 98.1 F (36.7 C)   Resp 18   Ht 5\' 10"  (1.778 m)   Wt 114.3 kg   SpO2 100%   BMI 36.16 kg/m   Physical Exam Vitals signs and nursing note reviewed.  Constitutional:      General: She is not in acute distress.    Appearance: She is obese. She is not ill-appearing.  HENT:     Head: Normocephalic and atraumatic.     Right Ear: Tympanic membrane and external ear normal.     Left Ear: Tympanic membrane and external ear normal.     Nose: Nose normal.     Mouth/Throat:     Mouth: Mucous membranes are moist.     Pharynx: Oropharynx is clear.  Eyes:     General: No scleral icterus.       Right eye: No discharge.        Left eye: No discharge.     Extraocular Movements: Extraocular movements intact.     Conjunctiva/sclera: Conjunctivae normal.     Pupils: Pupils are equal, round, and reactive to light.  Neck:     Musculoskeletal: Normal range of motion.     Vascular: No JVD.  Cardiovascular:     Rate and Rhythm: Normal rate  and regular rhythm.     Pulses: Normal pulses.          Radial pulses are 2+ on the right side and 2+ on the left side.     Heart sounds: Normal heart sounds.  Pulmonary:     Comments: Lungs clear to auscultation in all fields. Symmetric chest rise. No wheezing, rales, or rhonchi. Abdominal:     Tenderness: There is no right CVA tenderness or left CVA tenderness.     Comments: Abdomen is soft, non-distended, and non-tender in all quadrants. No rigidity, no guarding. No peritoneal signs.  Genitourinary:  Comments: Normal external genitalia. No pain with speculum insertion. Closed cervical os with normal appearance - no rash or lesions. No significant discharge or bleeding noted from cervix or in vaginal vault. On bimanual examination no adnexal tenderness or cervical motion tenderness. Chaperone Marva RN present during exam.  Musculoskeletal: Normal range of motion.  Skin:    General: Skin is warm and dry.     Capillary Refill: Capillary refill takes less than 2 seconds.  Neurological:     Mental Status: She is oriented to person, place, and time.     GCS: GCS eye subscore is 4. GCS verbal subscore is 5. GCS motor subscore is 6.     Comments: Fluent speech, no facial droop.  Psychiatric:        Mood and Affect: Affect is tearful.        Behavior: Behavior normal.      ED Treatments / Results  Labs (all labs ordered are listed, but only abnormal results are displayed) Labs Reviewed  URINE CULTURE  RPR  HIV ANTIBODY (ROUTINE TESTING W REFLEX)  URINALYSIS, ROUTINE W REFLEX MICROSCOPIC  GC/CHLAMYDIA PROBE AMP () NOT AT [facility]    EKG None  Radiology No results found.  Procedures Procedures (including critical care time)  Medications Ordered in ED Medications  cefTRIAXone (ROCEPHIN) injection 250 mg (has no administration in time range)  azithromycin (ZITHROMAX) tablet 1,000 mg (has no administration in time range)     Initial Impression / Assessment and Plan  / ED Course  I have reviewed the triage vital signs and the nursing notes.  Pertinent labs & imaging results that were available during my care of the patient were reviewed by me and considered in my medical decision making (see chart for details).   43 yo female presents with concerns for possible STD and UTI.  She is well-appearing, no acute distress. Afebrile.  Abdomen is nontender, no peritoneal signs.  No CVA tenderness. No rash seen on exam.  Pt not concerning for PID because hemodynamically stable and no cervical motion tenderness on pelvic exam. Also no genital lesions seen. Pt has been treated prophylacticly with azithromycin and rocephin due to pts history. Patient is requesting to leave before test results because she has to pick up her grandchildren.  I encouraged her to stay however she states she cannot.  Pt discharged with paper prescriptions if positive results because she uses a [facility] that does not take e-prescriptions. Strict ED return precautions discussed. Recommend pcp follow up.   02/08/19 6:13 PM I called pt and used 2 identifiers to confirm it was [employer]. I informed her that UA does not show signs of infection and that wet prep is negative for yeast, trich, and clue cells so BV unlikely. Pt will throw away paper prescriptions as she does not need them. Pt aware syphilis, HIV, and GC tests still pending.  This note was prepared using Dragon voice recognition software and may include unintentional dictation errors due to the inherent limitations of voice recognition software.   Final Clinical Impressions(s) / ED Diagnoses   Final diagnoses:  Possible exposure to STD    ED Discharge Orders         Ordered    cephALEXin (KEFLEX) 500 MG capsule  2 times daily,   Status:  Discontinued     02/07/19 1820    metroNIDAZOLE (FLAGYL) 500 MG tablet  2 times daily,   Status:  Discontinued     02/07/19 1820  Sherene Sireslbrizze, Kaitlyn E, PA-C  02/08/19 1820    Rolan BuccoBelfi, Melanie, MD 02/08/19 [phone]

## 2019-02-07 NOTE — Discharge Instructions (Addendum)
You have been seen today in the Emergency Department (ED) for STD testing.   You have been treated with a one time dose medication for gonorrhea and chlamydia. Please have your partner tested for STD's and do not resume sexual activity until you and your partner have confirmed negative test results or have both been treated.  Please follow up with your doctor as soon as possible regarding todays ED visit and your symptoms.   Return to the ED if your pain worsens, you develop a fever, or for any other symptoms that concern you.

## 2019-02-07 NOTE — ED Triage Notes (Signed)
Pt requesting STD check 

## 2019-02-09 LAB — URINE CULTURE: Culture: NO GROWTH

## 2019-02-09 LAB — RPR: RPR Ser Ql: NONREACTIVE

## 2019-02-09 LAB — HIV ANTIBODY (ROUTINE TESTING W REFLEX): HIV Screen 4th Generation wRfx: NONREACTIVE

## 2019-02-11 ENCOUNTER — Telehealth (HOSPITAL_COMMUNITY): Payer: Self-pay

## 2019-02-11 LAB — GC/CHLAMYDIA PROBE AMP (~~LOC~~) NOT AT ARMC
Chlamydia: NEGATIVE
Neisseria Gonorrhea: NEGATIVE

## 2019-09-14 ENCOUNTER — Encounter (HOSPITAL_BASED_OUTPATIENT_CLINIC_OR_DEPARTMENT_OTHER): Payer: Self-pay

## 2019-09-14 ENCOUNTER — Emergency Department (HOSPITAL_BASED_OUTPATIENT_CLINIC_OR_DEPARTMENT_OTHER)
Admission: EM | Admit: 2019-09-14 | Discharge: 2019-09-14 | Disposition: A | Discharge status: Home / Self Care | Payer: Self-pay | Attending: Emergency Medicine | Admitting: Emergency Medicine

## 2019-09-14 ENCOUNTER — Emergency Department (HOSPITAL_BASED_OUTPATIENT_CLINIC_OR_DEPARTMENT_OTHER): Payer: Self-pay

## 2019-09-14 ENCOUNTER — Other Ambulatory Visit: Payer: Self-pay

## 2019-09-14 DIAGNOSIS — F17210 Nicotine dependence, cigarettes, uncomplicated: Secondary | ICD-10-CM | POA: Insufficient documentation

## 2019-09-14 DIAGNOSIS — Z79899 Other long term (current) drug therapy: Secondary | ICD-10-CM | POA: Insufficient documentation

## 2019-09-14 DIAGNOSIS — R1031 Right lower quadrant pain: Secondary | ICD-10-CM | POA: Insufficient documentation

## 2019-09-14 DIAGNOSIS — Z7984 Long term (current) use of oral hypoglycemic drugs: Secondary | ICD-10-CM | POA: Insufficient documentation

## 2019-09-14 DIAGNOSIS — E119 Type 2 diabetes mellitus without complications: Secondary | ICD-10-CM | POA: Insufficient documentation

## 2019-09-14 DIAGNOSIS — I10 Essential (primary) hypertension: Secondary | ICD-10-CM | POA: Insufficient documentation

## 2019-09-14 LAB — CBC WITH DIFFERENTIAL/PLATELET
Abs Immature Granulocytes: 0.01 10*3/uL (ref 0.00–0.07)
Basophils Absolute: 0.1 10*3/uL (ref 0.0–0.1)
Basophils Relative: 1 %
Eosinophils Absolute: 0.2 10*3/uL (ref 0.0–0.5)
Eosinophils Relative: 3 %
HCT: 46.8 % — ABNORMAL HIGH (ref 36.0–46.0)
Hemoglobin: 14.7 g/dL (ref 12.0–15.0)
Immature Granulocytes: 0 %
Lymphocytes Relative: 42 %
Lymphs Abs: 3.1 10*3/uL (ref 0.7–4.0)
MCH: 28.3 pg (ref 26.0–34.0)
MCHC: 31.4 g/dL (ref 30.0–36.0)
MCV: 90.2 fL (ref 80.0–100.0)
Monocytes Absolute: 0.4 10*3/uL (ref 0.1–1.0)
Monocytes Relative: 6 %
Neutro Abs: 3.7 10*3/uL (ref 1.7–7.7)
Neutrophils Relative %: 48 %
Platelets: 225 10*3/uL (ref 150–400)
RBC: 5.19 MIL/uL — ABNORMAL HIGH (ref 3.87–5.11)
RDW: 13.4 % (ref 11.5–15.5)
WBC: 7.5 10*3/uL (ref 4.0–10.5)
nRBC: 0 % (ref 0.0–0.2)

## 2019-09-14 LAB — COMPREHENSIVE METABOLIC PANEL
ALT: 23 U/L (ref 0–44)
AST: 19 U/L (ref 15–41)
Albumin: 3.8 g/dL (ref 3.5–5.0)
Alkaline Phosphatase: 87 U/L (ref 38–126)
Anion gap: 8 (ref 5–15)
BUN: 13 mg/dL (ref 6–20)
CO2: 26 mmol/L (ref 22–32)
Calcium: 9.5 mg/dL (ref 8.9–10.3)
Chloride: 108 mmol/L (ref 98–111)
Creatinine, Ser: 0.9 mg/dL (ref 0.44–1.00)
GFR calc Af Amer: >60 mL/min (ref >60)
GFR calc non Af Amer: >60 mL/min (ref >60)
Glucose, Bld: 101 mg/dL — ABNORMAL HIGH (ref 70–99)
Potassium: 3.7 mmol/L (ref 3.5–5.1)
Sodium: 142 mmol/L (ref 135–145)
Total Bilirubin: 0.6 mg/dL (ref 0.3–1.2)
Total Protein: 7.8 g/dL (ref 6.5–8.1)

## 2019-09-14 LAB — PREGNANCY, URINE: Preg Test, Ur: NEGATIVE

## 2019-09-14 LAB — URINALYSIS, ROUTINE W REFLEX MICROSCOPIC
Bilirubin Urine: NEGATIVE
Glucose, UA: NEGATIVE mg/dL
Ketones, ur: NEGATIVE mg/dL
Leukocytes,Ua: NEGATIVE
Nitrite: NEGATIVE
Protein, ur: NEGATIVE mg/dL
Specific Gravity, Urine: 1.025 (ref 1.005–1.030)
pH: 6 (ref 5.0–8.0)

## 2019-09-14 LAB — URINALYSIS, MICROSCOPIC (REFLEX)

## 2019-09-14 MED ORDER — ONDANSETRON HCL 4 MG/2ML IJ SOLN
4.0000 mg | Freq: Once | INTRAMUSCULAR | Status: AC
Start: 2019-09-14 — End: 2019-09-14
  Administered 2019-09-14: 10:00:00 4 mg via INTRAVENOUS
  Filled 2019-09-14: qty 2

## 2019-09-14 MED ORDER — IOHEXOL 300 MG/ML  SOLN
100.0000 mL | Freq: Once | INTRAMUSCULAR | Status: AC | PRN
  Administered 2019-09-14: 12:00:00 100 mL via INTRAVENOUS

## 2019-09-14 MED ORDER — MORPHINE SULFATE (PF) 4 MG/ML IV SOLN
4.0000 mg | Freq: Once | INTRAVENOUS | Status: AC
  Administered 2019-09-14: 4 mg via INTRAVENOUS
  Filled 2019-09-14: qty 1

## 2019-09-14 MED ORDER — IBUPROFEN 800 MG PO TABS: 800.0000 mg | ORAL_TABLET | Freq: Three times a day (TID) | ORAL | 0 refills | Status: AC | PRN

## 2019-09-14 MED ORDER — KETOROLAC TROMETHAMINE 15 MG/ML IJ SOLN
30.0000 mg | Freq: Once | INTRAMUSCULAR | Status: AC
  Administered 2019-09-14: 30 mg via INTRAVENOUS
  Filled 2019-09-14: qty 2

## 2019-09-14 MED ORDER — SODIUM CHLORIDE 0.9 % IV BOLUS
1000.0000 mL | Freq: Once | INTRAVENOUS | Status: AC
  Administered 2019-09-14: 10:00:00 1000 mL via INTRAVENOUS

## 2019-09-14 NOTE — ED Notes (Signed)
Patient transported to CT 

## 2019-09-14 NOTE — ED Notes (Signed)
ED Provider at bedside. 

## 2019-09-14 NOTE — Discharge Instructions (Signed)
Return here as needed.  Follow-up with your primary doctor.  Your CT scan did not show any significant abnormalities at this time.

## 2019-09-14 NOTE — ED Triage Notes (Signed)
Pt reports lower abdominal pain X2-3 days with pain waking her from sleep this morning. Denies NVD, pt reports urinary frequency, denies discharge.

## 2019-09-14 NOTE — ED Notes (Signed)
Patient getting pain/nausea meds.  Will try to get for U/S after this.

## 2019-09-14 NOTE — ED Provider Notes (Addendum)
[facility] EMERGENCY DEPARTMENT Provider Note   CSN: [id number] Arrival date & time: 09/14/19  0855     History Chief Complaint  Patient presents with  . Abdominal Pain    Christine Callahan is a 44 y.o. female.  HPI Patient presents to the emergency department with lower abdominal pain that started she states a week ago but got worse over the last 2 days.  The patient states that the pain awoke her early this morning.  Patient states she has had a history of ovarian cyst and this feels similar.  Patient states that she is not had any vaginal bleeding, vaginal discharge, or dysuria.  The patient states that she not take any medications prior to arrival for her symptoms.  Patient states that certain movements and palpation make the pain worse.  The patient denies chest pain, shortness of breath, headache,blurred vision, neck pain, fever, cough, weakness, numbness, dizziness, anorexia, edema, nausea, vomiting, diarrhea, rash, back pain, dysuria, hematemesis, bloody stool, near syncope, or syncope.    Past Medical History:  Diagnosis Date  . Diabetes mellitus without complication (HCC)   . Genital herpes 2013  . GERD (gastroesophageal reflux disease)   . Gonorrhea 2015  . Hepatitis C 2016  . Hypertension   . Ovarian cyst     Patient Active Problem List   Diagnosis Date Noted  . Chest pain 06/04/2016  . Essential hypertension 06/04/2016  . Diabetes mellitus due to underlying condition (HCC) 06/04/2016  . Heart palpitations 06/04/2016  . Morbid obesity (HCC) 06/04/2016  . Tobacco abuse 06/04/2016  . Ingrown nail 09/19/2013  . Diabetic neuropathy (HCC) 09/19/2013  . Equinus deformity of foot, acquired 09/19/2013  . Plantar keratosis, acquired 09/19/2013  . Pain in lower limb 09/19/2013  . Lumbago 09/08/2011  . Pain in joint, ankle and foot 09/08/2011  . Sciatica of left side 09/08/2011    Past Surgical History:  Procedure Laterality Date  . ABDOMINAL HYSTERECTOMY     . CHOLECYSTECTOMY       OB History   No obstetric history on file.     Family History  Problem Relation Age of Onset  . Lupus Mother   . Seizures Father     Social History   Tobacco Use  . Smoking status: Current Every Day Smoker    Packs/day: 0.50    Years: 20.00    Pack years: 10.00    Types: Cigarettes  . Smokeless tobacco: Never Used  Substance Use Topics  . Alcohol use: Yes    Comment: occasional  . Drug use: No    Home Medications Prior to Admission medications   Medication Sig Start Date End Date Taking? Authorizing Provider  amLODipine (NORVASC) 10 MG tablet TAKE 1 TABLET BY MOUTH ONCE DAILY 12/11/18  Yes [provider]  atorvastatin (LIPITOR) 20 MG tablet Take by mouth. 03/19/19  Yes [provider]  carvedilol (COREG) 12.5 MG tablet Take 2 tablets (25 mg total) by mouth 2 (two) times daily. 08/07/17   Rolan Bucco, MD  cloNIDine (CATAPRES) 0.2 MG tablet Take 1 tablet (0.2 mg total) by mouth 2 (two) times daily. 08/07/17   Rolan Bucco, MD  gabapentin (NEURONTIN) 400 MG capsule Take 400 mg by mouth 2 (two) times daily as needed (pain).    [provider]  ibuprofen (ADVIL,MOTRIN) 800 MG tablet Take 1 tablet (800 mg total) by mouth 3 (three) times daily. 09/11/16   Law, Waylan Boga, PA-C  metFORMIN (GLUCOPHAGE) 1000 MG tablet Take  1,000 mg by mouth 2 (two) times daily with a meal.    [provider]  Multiple Vitamin (MULTIVITAMIN WITH MINERALS) TABS tablet Take 1 tablet by mouth daily.    [provider]  naproxen (NAPROSYN) 500 MG tablet Take 1 tablet (500 mg total) by mouth 2 (two) times daily. 11/27/16   Linwood Dibbles, MD  traMADol (ULTRAM) 50 MG tablet Take 1 tablet (50 mg total) by mouth every 6 (six) hours as needed. Patient not taking: Reported on 06/04/2016 05/20/16   Eyvonne Mechanic, PA-C  traMADol-acetaminophen (ULTRACET) 37.5-325 MG per tablet Take 1 tablet by mouth every 4 (four) hours as needed for pain. 10/16/11  11/16/11  Kirsteins, Victorino Sparrow, MD    Allergies    Patient has no known allergies.  Review of Systems   Review of Systems All other systems negative except as documented in the HPI. All pertinent positives and negatives as reviewed in the HPI. Physical Exam Updated Vital Signs BP (!) 162/102   Pulse 87   Temp 98.4 F (36.9 C) (Oral)   Resp (!) 21   Ht 5\' 10"  (1.778 m)   Wt 116.1 kg   SpO2 100%   BMI 36.73 kg/m   Physical Exam Vitals and nursing note reviewed.  Constitutional:      General: She is not in acute distress.    Appearance: She is well-developed.  HENT:     Head: Normocephalic and atraumatic.  Eyes:     Pupils: Pupils are equal, round, and reactive to light.  Cardiovascular:     Rate and Rhythm: Normal rate and regular rhythm.     Heart sounds: Normal heart sounds. No murmur. No friction rub. No gallop.   Pulmonary:     Effort: Pulmonary effort is normal. No respiratory distress.     Breath sounds: Normal breath sounds. No wheezing.  Abdominal:     General: Bowel sounds are normal. There is no distension.     Palpations: Abdomen is soft.     Tenderness: There is abdominal tenderness in the right lower quadrant. There is no guarding or rebound. Negative signs include psoas sign.     Hernia: No hernia is present.  Musculoskeletal:     Cervical back: Normal range of motion and neck supple.  Skin:    General: Skin is warm and dry.     Capillary Refill: Capillary refill takes less than 2 seconds.     Findings: No erythema or rash.  Neurological:     Mental Status: She is alert and oriented to person, place, and time.     Motor: No abnormal muscle tone.     Coordination: Coordination normal.  Psychiatric:        Behavior: Behavior normal.     ED Results / Procedures / Treatments   Labs (all labs ordered are listed, but only abnormal results are displayed) Labs Reviewed  COMPREHENSIVE METABOLIC PANEL  CBC WITH DIFFERENTIAL/PLATELET  URINALYSIS, ROUTINE W  REFLEX MICROSCOPIC  PREGNANCY, URINE    EKG None  Radiology No results found.  Procedures Procedures (including critical care time)  Medications Ordered in ED Medications  sodium chloride 0.9 % bolus 1,000 mL (1,000 mLs Intravenous New Bag/Given 09/14/19 0931)  morphine 4 MG/ML injection 4 mg (4 mg Intravenous Given 09/14/19 0939)  ondansetron (ZOFRAN) injection 4 mg (4 mg Intravenous Given 09/14/19 11/14/19)    ED Course  I have reviewed the triage vital signs and the nursing notes.  Pertinent labs & imaging results  that were available during my care of the patient were reviewed by me and considered in my medical decision making (see chart for details).    MDM Rules/Calculators/A&P                     The patient will be evaluated with laboratory testing and ultrasound of the pelvic region.  Patient be given pain control for further management of her symptoms.  Patient is advised the plan all questions are answered up and at this point.  The patient was advised of the initial results of the ultrasound and we did elect to do a CT scan to ensure that there is no further etiology for the pain.  The patient has deferred any vaginal exam stating she does not have any problems in that region and I did advise her that this could be the culprit of the pain.  The patient was concerned she may have a UTI at this point I do not feel that that is the cause.  I have advised the patient to return here as needed.  Told to follow-up with her primary doctor as well. Final Clinical Impression(s) / ED Diagnoses Final diagnoses:  RLQ abdominal pain    Rx / DC Orders ED Discharge Orders    None       Dalia Heading, PA-C 09/14/19 [id number]    Dalia Heading, PA-C 09/14/19 1235    Davonna Belling, MD 09/14/19 1430

## 2019-09-15 LAB — URINE CULTURE: Culture: <10000 — AB

## 2020-06-18 ENCOUNTER — Other Ambulatory Visit: Payer: Self-pay

## 2020-06-18 ENCOUNTER — Encounter (HOSPITAL_BASED_OUTPATIENT_CLINIC_OR_DEPARTMENT_OTHER): Payer: Self-pay | Admitting: Emergency Medicine

## 2020-06-18 ENCOUNTER — Emergency Department (HOSPITAL_BASED_OUTPATIENT_CLINIC_OR_DEPARTMENT_OTHER)
Admission: EM | Admit: 2020-06-18 | Discharge: 2020-06-18 | Disposition: A | Discharge status: Home / Self Care | Payer: Self-pay | Attending: Emergency Medicine | Admitting: Emergency Medicine

## 2020-06-18 DIAGNOSIS — J209 Acute bronchitis, unspecified: Secondary | ICD-10-CM

## 2020-06-18 DIAGNOSIS — J42 Unspecified chronic bronchitis: Secondary | ICD-10-CM

## 2020-06-18 DIAGNOSIS — E0840 Diabetes mellitus due to underlying condition with diabetic neuropathy, unspecified: Secondary | ICD-10-CM | POA: Insufficient documentation

## 2020-06-18 DIAGNOSIS — Z9049 Acquired absence of other specified parts of digestive tract: Secondary | ICD-10-CM | POA: Insufficient documentation

## 2020-06-18 DIAGNOSIS — I10 Essential (primary) hypertension: Secondary | ICD-10-CM | POA: Insufficient documentation

## 2020-06-18 DIAGNOSIS — F17210 Nicotine dependence, cigarettes, uncomplicated: Secondary | ICD-10-CM | POA: Insufficient documentation

## 2020-06-18 DIAGNOSIS — Z79899 Other long term (current) drug therapy: Secondary | ICD-10-CM | POA: Insufficient documentation

## 2020-06-18 DIAGNOSIS — J3489 Other specified disorders of nose and nasal sinuses: Secondary | ICD-10-CM | POA: Insufficient documentation

## 2020-06-18 DIAGNOSIS — J069 Acute upper respiratory infection, unspecified: Secondary | ICD-10-CM | POA: Insufficient documentation

## 2020-06-18 DIAGNOSIS — E0810 Diabetes mellitus due to underlying condition with ketoacidosis without coma: Secondary | ICD-10-CM | POA: Insufficient documentation

## 2020-06-18 DIAGNOSIS — J441 Chronic obstructive pulmonary disease with (acute) exacerbation: Secondary | ICD-10-CM | POA: Insufficient documentation

## 2020-06-18 DIAGNOSIS — K8689 Other specified diseases of pancreas: Secondary | ICD-10-CM | POA: Insufficient documentation

## 2020-06-18 DIAGNOSIS — Z7984 Long term (current) use of oral hypoglycemic drugs: Secondary | ICD-10-CM | POA: Insufficient documentation

## 2020-06-18 MED ORDER — BENZONATATE 100 MG PO CAPS: 100.0000 mg | ORAL_CAPSULE | Freq: Three times a day (TID) | ORAL | 0 refills | Status: DC

## 2020-06-18 MED ORDER — PROMETHAZINE-DM 6.25-15 MG/5ML PO SYRP: 5.0000 mL | ORAL_SOLUTION | Freq: Four times a day (QID) | ORAL | 0 refills | Status: AC | PRN

## 2020-06-18 MED ORDER — TRIAMCINOLONE ACETONIDE 55 MCG/ACT NA AERO: 2.0000 | INHALATION_SPRAY | Freq: Every day | NASAL | 0 refills | Status: AC

## 2020-06-18 MED ORDER — BENZONATATE 100 MG PO CAPS: 100.0000 mg | ORAL_CAPSULE | Freq: Three times a day (TID) | ORAL | 0 refills | Status: AC

## 2020-06-18 MED ORDER — ALBUTEROL SULFATE HFA 108 (90 BASE) MCG/ACT IN AERS
2.0000 | INHALATION_SPRAY | Freq: Once | RESPIRATORY_TRACT | Status: AC
  Administered 2020-06-18: 2 via RESPIRATORY_TRACT
  Filled 2020-06-18: qty 6.7

## 2020-06-18 MED ORDER — TRIAMCINOLONE ACETONIDE 55 MCG/ACT NA AERO: 2.0000 | INHALATION_SPRAY | Freq: Every day | NASAL | 0 refills | Status: DC

## 2020-06-18 MED ORDER — PROMETHAZINE-DM 6.25-15 MG/5ML PO SYRP: 5.0000 mL | ORAL_SOLUTION | Freq: Four times a day (QID) | ORAL | 0 refills | Status: DC | PRN

## 2020-06-18 MED ORDER — GUAIFENESIN 100 MG/5ML PO SOLN
5.0000 mL | Freq: Once | ORAL | Status: AC
  Administered 2020-06-18: 100 mg via ORAL
  Filled 2020-06-18: qty 10

## 2020-06-18 MED FILL — TRIAMCINOLONE ACETONIDE 55: 55 | 30 days supply | Qty: 17 | Fill #0

## 2020-06-18 MED FILL — BENZONATATE 100 MG CAPS: 100 | 7 days supply | Qty: 21 | Fill #0

## 2020-06-18 MED FILL — PROMETHAZINE W/DM SYRUP: 6.25-15 | 6 days supply | Qty: 118 | Fill #0

## 2020-06-18 NOTE — ED Provider Notes (Signed)
[facility] EMERGENCY DEPARTMENT Provider Note   CSN: [id number] Arrival date & time: 06/18/20  1025     History Chief Complaint  Patient presents with  . Cough    Christine Callahan is a 44 y.o. female presented for evaluation of cough.  Patient states she has had a persistent cough for about 6 months.  Over the past 2 weeks, it has worsened.  She reports she is now coughing up thick green phlegm.  She reports associated increased nasal drainage and a sore throat while coughing. Associated intermittent wheezing. She denies fevers, chills, chest pain, shortness of breath, nausea, vomiting abdominal pain.  She has been seen multiple times for this cough, prescribed a variety medications including albuterol, prednisone, cough syrup.  She states most of them have not been helpful, except the cough syrup.  She denies sick contacts.  She continue this to smoke cigarettes daily, smoking about 1 pack/day.  She has done this since she was 12.  She has a history of hypertension for which she takes medication, she reports no other medical problems and states she does not take any other medicines daily.  Per chart review, patient also with a history of diabetes and chronic pain.  I reviewed outside records, including multiple negative chest x-rays as well as a negative CTA since the cough began  HPI     Past Medical History:  Diagnosis Date  . Diabetes mellitus without complication (HCC)   . Genital herpes 2013  . GERD (gastroesophageal reflux disease)   . Gonorrhea 2015  . Hepatitis C 2016  . Hypertension   . Ovarian cyst     Patient Active Problem List   Diagnosis Date Noted  . Chest pain 06/04/2016  . Essential hypertension 06/04/2016  . Diabetes mellitus due to underlying condition (HCC) 06/04/2016  . Heart palpitations 06/04/2016  . Morbid obesity (HCC) 06/04/2016  . Tobacco abuse 06/04/2016  . Ingrown nail 09/19/2013  . Diabetic neuropathy (HCC) 09/19/2013  . 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 Plantar keratosis, acquired 09/19/2013  . Pain in lower limb 09/19/2013  . Lumbago 09/08/2011  . Pain in joint, ankle and foot 09/08/2011  . Sciatica of left side 09/08/2011    Past Surgical History:  Procedure Laterality Date  . ABDOMINAL HYSTERECTOMY    . CHOLECYSTECTOMY       OB History   No obstetric history on file.     Family History  Problem Relation Age of Onset  . Lupus Mother   . Seizures Father     Social History   Tobacco Use  . Smoking status: Current Every Day Smoker    Packs/day: 0.50    Years: 20.00    Pack years: 10.00    Types: Cigarettes  . Smokeless tobacco: Never Used  Substance Use Topics  . Alcohol use: Yes    Comment: occasional  . Drug use: No    Home Medications Prior to Admission medications   Medication Sig Start Date End Date Taking? Authorizing Provider  amLODipine (NORVASC) 10 MG tablet TAKE 1 TABLET BY MOUTH ONCE DAILY 12/11/18   [provider]  atorvastatin (LIPITOR) 20 MG tablet Take by mouth. 03/19/19   [provider]  benzonatate (TESSALON) 100 MG capsule Take 1 capsule (100 mg total) by mouth every 8 (eight) hours. 06/18/20   Feras Gardella, PA-C  carvedilol (COREG) 12.5 MG tablet Take 2 tablets (25 mg total) by mouth 2 (two) times daily. 08/07/17  Rolan Bucco, MD  cloNIDine (CATAPRES) 0.2 MG tablet Take 1 tablet (0.2 mg total) by mouth 2 (two) times daily. 08/07/17   Rolan Bucco, MD  gabapentin (NEURONTIN) 400 MG capsule Take 400 mg by mouth 2 (two) times daily as needed (pain).    [provider]  ibuprofen (ADVIL) 800 MG tablet Take 1 tablet (800 mg total) by mouth every 8 (eight) hours as needed. 09/14/19   Lawyer, Cristal Deer, PA-C  ibuprofen (ADVIL,MOTRIN) 800 MG tablet Take 1 tablet (800 mg total) by mouth 3 (three) times daily. 09/11/16   Law, Waylan Boga, PA-C  metFORMIN (GLUCOPHAGE) 1000 MG tablet Take 1,000 mg by mouth 2 (two) times daily with a  meal.    [provider]  Multiple Vitamin (MULTIVITAMIN WITH MINERALS) TABS tablet Take 1 tablet by mouth daily.    [provider]  naproxen (NAPROSYN) 500 MG tablet Take 1 tablet (500 mg total) by mouth 2 (two) times daily. 11/27/16   Linwood Dibbles, MD  promethazine-dextromethorphan (PROMETHAZINE-DM) 6.25-15 MG/5ML syrup Take 5 mLs by mouth 4 (four) times daily as needed for cough. 06/18/20   Koula Venier, PA-C  traMADol (ULTRAM) 50 MG tablet Take 1 tablet (50 mg total) by mouth every 6 (six) hours as needed. Patient not taking: Reported on 06/04/2016 05/20/16   Eyvonne Mechanic, PA-C  triamcinolone (NASACORT) 55 MCG/ACT AERO nasal inhaler Place 2 sprays into the nose daily. 06/18/20   Isabela Nardelli, PA-C  traMADol-acetaminophen (ULTRACET) 37.5-325 MG per tablet Take 1 tablet by mouth every 4 (four) hours as needed for pain. 10/16/11 11/16/11  Kirsteins, Victorino Sparrow, MD    Allergies    Patient has no known allergies.  Review of Systems   Review of Systems  HENT: Positive for congestion and sore throat.   Respiratory: Positive for cough and wheezing (intermittent).     Physical Exam Updated Vital Signs BP 134/88   Pulse 83   Temp 98.7 F (37.1 C) (Oral)   Resp 16   Ht 5\' 10"  (1.778 m)   Wt 118.8 kg   SpO2 100%   BMI 37.59 kg/m   Physical Exam Vitals and nursing note reviewed.  Constitutional:      General: She is not in acute distress.    Appearance: She is well-developed and well-nourished. She is obese.     Comments: Resting comfortably in the bed in no acute distress  HENT:     Head: Normocephalic and atraumatic.     Comments: Nasal mucosal edema and erythema.  No sinus tenderness.  OP clear without tonsillar swelling or exudate. Eyes:     Extraocular Movements: EOM normal.     Conjunctiva/sclera: Conjunctivae normal.     Pupils: Pupils are equal, round, and reactive to light.  Cardiovascular:     Rate and Rhythm: Normal rate and regular rhythm.      Pulses: Normal pulses and intact distal pulses.  Pulmonary:     Effort: Pulmonary effort is normal. No respiratory distress.     Breath sounds: Normal breath sounds. No wheezing.     Comments: Speaking in full sentences.  Clear lung sounds in all fields.  Sats stable on room air.  No wheezing Abdominal:     General: There is no distension.     Palpations: Abdomen is soft. There is no mass.     Tenderness: There is no abdominal tenderness. There is no guarding or rebound.  Musculoskeletal:        General: Normal range of motion.  Cervical back: Normal range of motion and neck supple.     Right lower leg: No edema.     Left lower leg: No edema.  Skin:    General: Skin is warm and dry.     Capillary Refill: Capillary refill takes less than 2 seconds.  Neurological:     Mental Status: She is alert and oriented to person, place, and time.  Psychiatric:        Mood and Affect: Mood and affect normal.     ED Results / Procedures / Treatments   Labs (all labs ordered are listed, but only abnormal results are displayed) Labs Reviewed - No data to display  EKG None  Radiology No results found.  Procedures Procedures (including critical care time)  Medications Ordered in ED Medications  albuterol (VENTOLIN HFA) 108 (90 Base) MCG/ACT inhaler 2 puff (has no administration in time range)    ED Course  I have reviewed the triage vital signs and the nursing notes.  Pertinent labs & imaging results that were available during my care of the patient were reviewed by me and considered in my medical decision making (see chart for details).    MDM Rules/Calculators/A&P                          Patient presenting for evaluation of worsening chronic cough.  On exam, patient appears nontoxic.  Pulmonary exam is overall reassuring, no wheezing.  No abnormal lung sounds.  Due to patient's long smoking history and chronic cough, favor bronchitis.  Consider acute exacerbation over the past  2 weeks.  As patient is afebrile and with clear lung sounds, doubt bacterial infection such as pneumonia.  Offered chest x-ray, patient declined.  Patient is concerned that her cough is worsened due to her sinus drainage.  Discussed symptomatic treatment with nasal spray and cough syrup.  Offered prednisone, patient declined.  Will give albuterol to take as needed, encourage patient to follow-up closely with primary care.  Encourage smoking cessation.  As patient has had a negative CTA to rule a PE, I do not believe repeat would be beneficial.  At this time, patient appears safe for discharge.  Return precautions given.  Patient states she understands and agrees to plan  Final Clinical Impression(s) / ED Diagnoses Final diagnoses:  Chronic bronchitis with acute exacerbation (HCC)  Viral URI with cough    Rx / DC Orders ED Discharge Orders         Ordered    benzonatate (TESSALON) 100 MG capsule  Every 8 hours        06/18/20 1112    promethazine-dextromethorphan (PROMETHAZINE-DM) 6.25-15 MG/5ML syrup  4 times daily PRN,   Status:  Discontinued        06/18/20 1112    triamcinolone (NASACORT) 55 MCG/ACT AERO nasal inhaler  Daily        06/18/20 1112    promethazine-dextromethorphan (PROMETHAZINE-DM) 6.25-15 MG/5ML syrup  4 times daily PRN        06/18/20 1114           [place], [place], PA-C 06/18/20 1121    Cheryll Cockayne, MD 06/24/20 0004

## 2020-06-18 NOTE — ED Notes (Signed)
Pt requests initial dose of cough medicine prior to discharge and pharmacy to be changed.  Provider made aware, made change to pharmacy and ordered one dose cough medicine given as ordered

## 2020-06-18 NOTE — ED Triage Notes (Signed)
Pt states she has consistent cough for months.  Has been treated for same but hasn't gone away.  No fever.  Coughing up green sputum.  Some sinus drainage but no tenderness.

## 2020-06-18 NOTE — Discharge Instructions (Addendum)
Use albuterol every 4 hours while awake for the next 2 days.  After this, use as needed for shortness of breath, wheezing, coughing fits. Use nasal spray daily until sinus drainage improves. Use cough drops and syrup as needed for cough control. Follow-up with primary care doctor next week for recheck of your symptoms. Return to the emergency room if you develop high fevers, difficulty breathing, severe chest pain, any new, worsening, or concerning symptoms.

## 2020-07-14 ENCOUNTER — Encounter (HOSPITAL_BASED_OUTPATIENT_CLINIC_OR_DEPARTMENT_OTHER): Payer: Self-pay

## 2020-07-14 ENCOUNTER — Emergency Department (HOSPITAL_BASED_OUTPATIENT_CLINIC_OR_DEPARTMENT_OTHER)
Admission: EM | Admit: 2020-07-14 | Discharge: 2020-07-14 | Disposition: A | Discharge status: Home / Self Care | Payer: 59 | Attending: Emergency Medicine | Admitting: Emergency Medicine

## 2020-07-14 ENCOUNTER — Other Ambulatory Visit: Payer: Self-pay

## 2020-07-14 DIAGNOSIS — Z5321 Procedure and treatment not carried out due to patient leaving prior to being seen by health care provider: Secondary | ICD-10-CM | POA: Insufficient documentation

## 2020-07-14 DIAGNOSIS — U071 COVID-19: Secondary | ICD-10-CM | POA: Diagnosis not present

## 2020-07-14 DIAGNOSIS — R0602 Shortness of breath: Secondary | ICD-10-CM | POA: Diagnosis present

## 2020-07-14 NOTE — ED Triage Notes (Addendum)
Pt c/o SOB-states she was dx with covid 2 days ago-NAD-steady gait
